# Patient Record
Sex: Female | Born: 1955 | Race: White | Hispanic: No | Marital: Married | Smoking: Never smoker
Health system: Southern US, Community
[De-identification: ages and names within clinical notes are randomized; demographics above are authoritative.]

## PROBLEM LIST (undated history)

## (undated) DIAGNOSIS — R0989 Other specified symptoms and signs involving the circulatory and respiratory systems: Secondary | ICD-10-CM

## (undated) DIAGNOSIS — R011 Cardiac murmur, unspecified: Secondary | ICD-10-CM

## (undated) DIAGNOSIS — M1990 Unspecified osteoarthritis, unspecified site: Secondary | ICD-10-CM

## (undated) DIAGNOSIS — Z8679 Personal history of other diseases of the circulatory system: Secondary | ICD-10-CM

## (undated) DIAGNOSIS — E039 Hypothyroidism, unspecified: Secondary | ICD-10-CM

## (undated) DIAGNOSIS — Z87448 Personal history of other diseases of urinary system: Secondary | ICD-10-CM

## (undated) DIAGNOSIS — J309 Allergic rhinitis, unspecified: Secondary | ICD-10-CM

## (undated) DIAGNOSIS — T7840XA Allergy, unspecified, initial encounter: Secondary | ICD-10-CM

## (undated) DIAGNOSIS — N323 Diverticulum of bladder: Secondary | ICD-10-CM

## (undated) DIAGNOSIS — D649 Anemia, unspecified: Secondary | ICD-10-CM

## (undated) HISTORY — DX: Anemia, unspecified: D64.9

## (undated) HISTORY — DX: Allergic rhinitis, unspecified: J30.9

## (undated) HISTORY — DX: Cardiac murmur, unspecified: R01.1

## (undated) HISTORY — PX: COLONOSCOPY: SHX174

## (undated) HISTORY — DX: Other specified symptoms and signs involving the circulatory and respiratory systems: R09.89

## (undated) HISTORY — DX: Diverticulum of bladder: N32.3

## (undated) HISTORY — DX: Personal history of other diseases of the circulatory system: Z86.79

## (undated) HISTORY — DX: Unspecified osteoarthritis, unspecified site: M19.90

## (undated) HISTORY — DX: Personal history of other diseases of urinary system: Z87.448

## (undated) HISTORY — PX: OTHER SURGICAL HISTORY: SHX169

## (undated) HISTORY — DX: Allergy, unspecified, initial encounter: T78.40XA

## (undated) HISTORY — PX: NO PAST SURGERIES: SHX2092

## (undated) HISTORY — DX: Hypothyroidism, unspecified: E03.9

---

## 2004-12-23 ENCOUNTER — Ambulatory Visit: Payer: Self-pay | Admitting: Internal Medicine

## 2005-09-27 ENCOUNTER — Ambulatory Visit: Payer: Self-pay | Admitting: Internal Medicine

## 2005-10-11 ENCOUNTER — Ambulatory Visit: Payer: Self-pay | Admitting: Internal Medicine

## 2005-10-11 LAB — HM COLONOSCOPY

## 2006-09-16 ENCOUNTER — Ambulatory Visit: Payer: Self-pay | Admitting: Internal Medicine

## 2006-09-16 LAB — CONVERTED CEMR LAB: TSH: 4.58 microintl units/mL (ref 0.35–5.50)

## 2007-01-23 ENCOUNTER — Ambulatory Visit: Payer: Self-pay | Admitting: Internal Medicine

## 2007-01-23 LAB — CONVERTED CEMR LAB
ALT: 16 units/L (ref 0–35)
AST: 20 units/L (ref 0–37)
Alkaline Phosphatase: 46 units/L (ref 39–117)
BUN: 7 mg/dL (ref 6–23)
Basophils Relative: 0.7 % (ref 0.0–1.0)
Bilirubin Urine: NEGATIVE
CO2: 26 meq/L (ref 19–32)
Calcium: 9.1 mg/dL (ref 8.4–10.5)
Chloride: 105 meq/L (ref 96–112)
Eosinophils Relative: 2.1 % (ref 0.0–5.0)
GFR calc Af Amer: 97 mL/min
Ketones, ur: NEGATIVE mg/dL
LDL Cholesterol: 62 mg/dL (ref 0–99)
Lymphocytes Relative: 27.5 % (ref 12.0–46.0)
Monocytes Relative: 5.7 % (ref 3.0–11.0)
Neutro Abs: 4.5 10*3/uL (ref 1.4–7.7)
Nitrite: NEGATIVE
Platelets: 239 10*3/uL (ref 150–400)
RBC: 3.96 M/uL (ref 3.87–5.11)
RDW: 12.4 % (ref 11.5–14.6)
Specific Gravity, Urine: 1.01 (ref 1.000–1.03)
Total CHOL/HDL Ratio: 1.9
Total Protein, Urine: NEGATIVE mg/dL
Total Protein: 6.7 g/dL (ref 6.0–8.3)
Triglycerides: 107 mg/dL (ref 0–149)
Urine Glucose: NEGATIVE mg/dL
VLDL: 21 mg/dL (ref 0–40)
WBC: 7.2 10*3/uL (ref 4.5–10.5)
pH: 6.5 (ref 5.0–8.0)

## 2007-01-24 ENCOUNTER — Encounter: Payer: Self-pay | Admitting: Internal Medicine

## 2007-01-30 ENCOUNTER — Ambulatory Visit: Payer: Self-pay | Admitting: Internal Medicine

## 2007-01-30 DIAGNOSIS — Z87448 Personal history of other diseases of urinary system: Secondary | ICD-10-CM

## 2007-01-30 DIAGNOSIS — J309 Allergic rhinitis, unspecified: Secondary | ICD-10-CM | POA: Insufficient documentation

## 2007-01-30 DIAGNOSIS — R0989 Other specified symptoms and signs involving the circulatory and respiratory systems: Secondary | ICD-10-CM | POA: Insufficient documentation

## 2007-01-30 DIAGNOSIS — D649 Anemia, unspecified: Secondary | ICD-10-CM

## 2007-01-30 DIAGNOSIS — Z8679 Personal history of other diseases of the circulatory system: Secondary | ICD-10-CM

## 2007-01-30 DIAGNOSIS — N323 Diverticulum of bladder: Secondary | ICD-10-CM

## 2007-01-30 HISTORY — DX: Personal history of other diseases of the circulatory system: Z86.79

## 2007-01-30 HISTORY — DX: Allergic rhinitis, unspecified: J30.9

## 2007-01-30 HISTORY — DX: Other specified symptoms and signs involving the circulatory and respiratory systems: R09.89

## 2007-01-30 HISTORY — DX: Personal history of other diseases of urinary system: Z87.448

## 2007-01-30 HISTORY — DX: Anemia, unspecified: D64.9

## 2007-01-30 HISTORY — DX: Diverticulum of bladder: N32.3

## 2007-02-08 ENCOUNTER — Encounter: Payer: Self-pay | Admitting: Internal Medicine

## 2007-02-27 ENCOUNTER — Encounter: Payer: Self-pay | Admitting: Internal Medicine

## 2007-02-27 ENCOUNTER — Ambulatory Visit: Payer: Self-pay

## 2007-02-27 ENCOUNTER — Telehealth: Payer: Self-pay | Admitting: Internal Medicine

## 2007-03-16 ENCOUNTER — Telehealth: Payer: Self-pay | Admitting: Internal Medicine

## 2008-10-02 LAB — CONVERTED CEMR LAB: Pap Smear: NORMAL

## 2009-01-27 ENCOUNTER — Ambulatory Visit: Payer: Self-pay | Admitting: Internal Medicine

## 2009-01-27 LAB — CONVERTED CEMR LAB
ALT: 16 units/L (ref 0–35)
AST: 19 units/L (ref 0–37)
Alkaline Phosphatase: 49 units/L (ref 39–117)
BUN: 10 mg/dL (ref 6–23)
Bilirubin Urine: NEGATIVE
Bilirubin, Direct: 0.1 mg/dL (ref 0.0–0.3)
Cholesterol: 169 mg/dL (ref 0–200)
Creatinine, Ser: 0.8 mg/dL (ref 0.4–1.2)
Eosinophils Relative: 1.6 % (ref 0.0–5.0)
GFR calc non Af Amer: 79.23 mL/min (ref >60)
Ketones, ur: NEGATIVE mg/dL
LDL Cholesterol: 65 mg/dL (ref 0–99)
Monocytes Relative: 5 % (ref 3.0–12.0)
Neutrophils Relative %: 62.9 % (ref 43.0–77.0)
Nitrite: NEGATIVE
Platelets: 274 10*3/uL (ref 150.0–400.0)
Potassium: 5.1 meq/L (ref 3.5–5.1)
RBC: 3.92 M/uL (ref 3.87–5.11)
Total Bilirubin: 0.6 mg/dL (ref 0.3–1.2)
Total CHOL/HDL Ratio: 2
Total Protein, Urine: NEGATIVE mg/dL
Triglycerides: 110 mg/dL (ref 0.0–149.0)
Urine Glucose: NEGATIVE mg/dL
VLDL: 22 mg/dL (ref 0.0–40.0)
WBC: 7.1 10*3/uL (ref 4.5–10.5)
pH: 6.5 (ref 5.0–8.0)

## 2009-02-24 ENCOUNTER — Ambulatory Visit: Payer: Self-pay | Admitting: Internal Medicine

## 2009-02-25 DIAGNOSIS — E039 Hypothyroidism, unspecified: Secondary | ICD-10-CM | POA: Insufficient documentation

## 2009-02-25 HISTORY — DX: Hypothyroidism, unspecified: E03.9

## 2010-03-17 NOTE — Assessment & Plan Note (Signed)
Summary: CPX / # / CD   Vital Signs:  Patient profile:   55 year old female Height:      67 inches Weight:      141 pounds BMI:     22.16 O2 Sat:      99 % on Room air Temp:     98.4 degrees F oral Pulse rate:   83 / minute BP sitting:   138 / 70  (left arm) Cuff size:   regular  Vitals Entered By: Ami Bullins CMA (February 24, 2009 1:24 PM)  O2 Flow:  Room air  Primary Care Provider:  Chimaobi Casebolt   History of Present Illness: Patient presents for medical follow up. In the interval since her last visit she has been generally healthy, i.e. no surgery, no hospitalizations no major injuries.   She does have some right hip pain - worse with first getting up and about.  Pain in the calve of the left leg - sore by the end of the day. Minor discomfort Gnarly DIP  of the right index - not really painful   Current with Gyn - Pelvic/PAP, normal Mammogram and breast exam.   Current Medications (verified): 1)  Levothyroxine Sodium 75 Mcg  Tabs (Levothyroxine Sodium) .Marland Kitchen.. 1 Once Daily 2)  Advil 200 Mg  Tabs (Ibuprofen) .... As Needed 3)  Sudafed 30 Mg  Tabs (Pseudoephedrine Hcl) .... As Needed 4)  Claritin 10 Mg  Tabs (Loratadine) .... As Needed 5)  Apri 0.15-30 Mg-Mcg Tabs (Desogestrel-Ethinyl Estradiol) .Marland Kitchen.. 1 Daily  Allergies (verified): No Known Drug Allergies  Past History:  Past Surgical History: Last updated: 02/14/2007 G2PS  Family History: Last updated: 2007/02/14 father - died 85 MVA EtOH mother -died 30, sudden death (heart vs. stroke), EtOH, HTN, hypothyroid 2 brothers - youngest with joint problems Neg- colon, breast cancer;  Secondary kinship with DM, RA  Risk Factors: Alcohol Use: <1 (February 14, 2007) Exercise: yes (February 14, 2007)  Risk Factors: Smoking Status: never (02-14-2007)  Past Medical History: UNSPECIFIED HYPOTHYROIDISM (ICD-244.9) CAROTID BRUIT, RIGHT (ICD-785.9) HEART MURMUR, HX OF (ICD-V12.50) UTI'S, HX OF (ICD-V13.00) DIVERTICULUM, BLADDER  (ICD-596.3) ANEMIA-NOS (ICD-285.9) ALLERGIC RHINITIS (ICD-477.9)       Physician Roster:          Gyn - Marlena Clipper, NP at [facility]  Social History: [place]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 1979 1 daughter 65 RN; 1 son 1988 [place]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, 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    Physical Exam  General:  WNWD white female looking younger than her stated age in no distress Head:  Normocephalic and atraumatic without obvious abnormalities. No apparent alopecia or balding. Eyes:  vision grossly intact, pupils equal, pupils round, corneas and lenses clear, no injection, no optic disk abnormalities, and no retinal abnormalitiies.   Ears:  External ear exam shows no significant lesions or deformities.  Otoscopic examination reveals clear canals, tympanic membranes are intact bilaterally without bulging, retraction, inflammation or discharge. Hearing is grossly normal bilaterally. Nose:  External nasal examination shows no deformity or inflammation. Nasal mucosa are pink and moist without lesions  or exudates. Mouth:  Oral mucosa and oropharynx without lesions or exudates.  Teeth in good repair. Neck:  supple, full ROM, and no thyromegaly.  Feint carotid bruits, high pitched, right neck. Chest Wall:  no deformities and no mass.   Breasts:  deferred to Gyn Lungs:  Normal respiratory effort, chest expands  symmetrically. Lungs are clear to auscultation, no crackles or wheezes. Heart:  Normal rate and regular rhythm. S1 and S2 normal without gallop, murmur, click, rub or other extra sounds. Abdomen:  soft, non-tender, normal bowel sounds, no guarding, and no hepatomegaly.   Genitalia:  deferred to gyn Msk:  normal ROM, no joint tenderness, no joint warmth, no redness over joints, and no joint instability.  DIP right index finger enlarged without synovial thickening, not tender. Pulses:  2+ radial and DP pulses Extremities:  No clubbing, cyanosis, edema, or deformity noted with normal full range of motion of all joints.   Neurologic:  alert & oriented X3, cranial nerves II-XII intact, strength normal in all extremities, gait normal, and DTRs symmetrical and normal.   Skin:  turgor normal, color normal, no suspicious lesions, no ulcerations, and no edema.   Cervical Nodes:  no anterior cervical adenopathy and no posterior cervical adenopathy.   Psych:  Oriented X3, memory intact for recent and remote, normally interactive, and good eye contact.     Impression & Recommendations:  Problem # 1:  CAROTID BRUIT, RIGHT (ICD-785.9) Reviewed Carotid Doppler from Dec '09 - normal with no evidence of Internal carotid occlusion. Bruits still auscultable - suspect external carotid origin. No further evaluation is indicated.   Problem # 2:  ANEMIA-NOS (ICD-285.9) Stable with recent lab revealing normal hemoglobin  Problem # 3:  UNSPECIFIED HYPOTHYROIDISM (ICD-244.9) Patient is on thyroid supplementation and her TSH is normal. she is asymptomatic.  Plan - continue present dose of medication.  Her updated medication list for this problem includes:    Levothyroxine Sodium 75 Mcg Tabs (Levothyroxine sodium) .Marland Kitchen... 1 once daily  Problem # 4:  Preventive Health Care (ICD-V70.0) Unremarkable history except for minor hip discomfort, early minor degenerative changes hands and minor leg discomfort left lateral  calve with a normal exam. She is current with her gyncecologist. She is up to date for colorectal cancer screening. Immunization record is incomplete and we will need to document tetnus status. She has had flu vaccine this year..  In summary - a very nice woman who is in good health. She is advised to return for general medical exam in 2-3 years. Thyroid function monitoring should be repeated in 1 year. She will need routine repeat lab, e.g. lipid panel, general chemistries in 3-5 years. Of course she may return as needed.  Complete Medication List: 1)  Levothyroxine Sodium 75 Mcg Tabs (Levothyroxine sodium) .Marland Kitchen.. 1 once daily 2)  Advil 200 Mg Tabs (Ibuprofen) .... As needed 3)  Sudafed 30 Mg Tabs (Pseudoephedrine hcl) .... As needed 4)  Claritin 10 Mg Tabs (Loratadine) .... As needed 5)  Apri 0.15-30 Mg-mcg Tabs (Desogestrel-ethinyl estradiol) .Marland Kitchen.. 1 daily   Patient: Amber Soto Note: All result statuses are Final unless otherwise noted.  Tests: (1) Lipid Panel (LIPID)   Cholesterol               169 mg/dL                   1-610     ATP III Classification            Desirable:  <  200 mg/dL                    Borderline High:  200 - 239 mg/dL               High:  > = 240 mg/dL   Triglycerides             110.0 mg/dL                 1.6-109.6     Normal:  <150 mg/dL     Borderline High:  045 - 199 mg/dL   HDL                       40.98 mg/dL                 >11.91   VLDL Cholesterol          22.0 mg/dL                  4.7-82.9   LDL Cholesterol           65 mg/dL                    5-62  CHO/HDL Ratio:  CHD Risk                             2                    Men          Women     1/2 Average Risk     3.4          3.3     Average Risk          5.0          4.4     2X Average Risk          9.6          7.1     3X Average Risk          15.0          11.0                           Tests: (2) BMP (METABOL)   Sodium                    141 mEq/L                   135-145    Potassium                 5.1 mEq/L                   3.5-5.1   Chloride                  107 mEq/L                   96-112   Carbon Dioxide            27 mEq/L                    19-32   Glucose              [H]  100 mg/dL  70-99   BUN                       10 mg/dL                    2-13   Creatinine                0.8 mg/dL                   0.8-6.5   Calcium                   9.2 mg/dL                   7.8-46.9   GFR                       79.23 mL/min                >60  Tests: (3) CBC Platelet w/Diff (CBCD)   White Cell Count          7.1 K/uL                    4.5-10.5   Red Cell Count            3.92 Mil/uL                 3.87-5.11   Hemoglobin                13.6 g/dL                   62.9-52.8   Hematocrit                39.3 %                      36.0-46.0   MCV                  [H]  100.3 fl                    78.0-100.0   MCHC                      34.6 g/dL                   41.3-24.4   RDW                       12.1 %                      11.5-14.6   Platelet Count            274.0 K/uL                  150.0-400.0   Neutrophil %              62.9 %                      43.0-77.0   Lymphocyte %              29.6 %                      12.0-46.0   Monocyte %  5.0 %                       3.0-12.0   Eosinophils%              1.6 %                       0.0-5.0   Basophils %               0.9 %                       0.0-3.0   Neutrophill Absolute      4.4 K/uL                    1.4-7.7   Lymphocyte Absolute       2.1 K/uL                    0.7-4.0   Monocyte Absolute         0.4 K/uL                    0.1-1.0  Eosinophils, Absolute                             0.1 K/uL                    0.0-0.7   Basophils Absolute        0.1 K/uL                    0.0-0.1  Tests: (4) Hepatic/Liver Function Panel (HEPATIC)   Total Bilirubin           0.6 mg/dL                   6.2-9.5   Direct Bilirubin          0.1 mg/dL                   2.8-4.1    Alkaline Phosphatase      49 U/L                      39-117   AST                       19 U/L                      0-37   ALT                       16 U/L                      0-35   Total Protein             7.2 g/dL                    3.2-4.4   Albumin                   3.7 g/dL                    0.1-0.2  Tests: (5) TSH (TSH)   FastTSH  4.66 uIU/mL                 0.35-5.50  Tests: (6) UDip w/Micro (URINE)   Color                     LT. YELLOW       RANGE:  Yellow;Lt. Yellow   Clarity                   CLEAR                       Clear   Specific Gravity          <=1.005                     1.000 - 1.030   Urine Ph                  6.5                         5.0-8.0   Protein                   NEGATIVE                    Negative   Urine Glucose             NEGATIVE                    Negative   Ketones                   NEGATIVE                    Negative   Urine Bilirubin           NEGATIVE                    Negative   Blood                     NEGATIVE                    Negative   Urobilinogen              0.2                         0.0 - 1.0   Leukocyte Esterace        TRACE                       Negative   Nitrite                   NEGATIVE                    Negative   Urine WBC                 3-6/hpf                     0-2/hpf   Urine Epith               Few(5-10/hpf)               Rare(0-4/hpf)   Urine Bacteria  Few(10-50/hpf)              NonePrescriptions: LEVOTHYROXINE SODIUM 75 MCG  TABS (LEVOTHYROXINE SODIUM) 1 once daily  #90 x 3   Entered and Authorized by:   Jacques Navy MD   Signed by:   Jacques Navy MD on 02/24/2009   Method used:   Electronically to        [facility] [address]       [address], [place]  [address]       Ph: [phone]       Fax: [phone]   RxID:   [id number]    Preventive Care Screening  Mammogram:    Date:  10/02/2008    Results:  normal   Pap Smear:    Date:   10/02/2008    Results:  normal

## 2010-10-05 ENCOUNTER — Ambulatory Visit: Payer: Self-pay

## 2010-10-05 DIAGNOSIS — Z0000 Encounter for general adult medical examination without abnormal findings: Secondary | ICD-10-CM

## 2010-10-05 LAB — LIPID PANEL
HDL: 92.9 mg/dL (ref >39.00)
LDL Cholesterol: 69 mg/dL (ref 0–99)
Total CHOL/HDL Ratio: 2
VLDL: 14.6 mg/dL (ref 0.0–40.0)

## 2010-10-05 LAB — CBC WITH DIFFERENTIAL/PLATELET
Basophils Absolute: 0 10*3/uL (ref 0.0–0.1)
Hemoglobin: 13.9 g/dL (ref 12.0–15.0)
Lymphocytes Relative: 43.6 % (ref 12.0–46.0)
Monocytes Relative: 7.6 % (ref 3.0–12.0)
Platelets: 187 10*3/uL (ref 150.0–400.0)
RDW: 13.6 % (ref 11.5–14.6)

## 2010-10-05 LAB — BASIC METABOLIC PANEL
CO2: 27 mEq/L (ref 19–32)
Chloride: 105 mEq/L (ref 96–112)
GFR: 79.9 mL/min (ref >60.00)
Glucose, Bld: 80 mg/dL (ref 70–99)
Potassium: 5.3 mEq/L — ABNORMAL HIGH (ref 3.5–5.1)
Sodium: 141 mEq/L (ref 135–145)

## 2010-10-05 LAB — HEPATIC FUNCTION PANEL
AST: 22 U/L (ref 0–37)
Albumin: 4.2 g/dL (ref 3.5–5.2)

## 2010-10-05 LAB — URINALYSIS
Bilirubin Urine: NEGATIVE
Hgb urine dipstick: NEGATIVE
Nitrite: NEGATIVE
Total Protein, Urine: NEGATIVE
Urobilinogen, UA: 0.2 (ref 0.0–1.0)

## 2010-10-05 LAB — TSH: TSH: 3.6 u[IU]/mL (ref 0.35–5.50)

## 2010-10-08 ENCOUNTER — Encounter: Payer: Self-pay | Admitting: Internal Medicine

## 2010-10-08 ENCOUNTER — Ambulatory Visit (INDEPENDENT_AMBULATORY_CARE_PROVIDER_SITE_OTHER): Payer: 59 | Admitting: Internal Medicine

## 2010-10-08 VITALS — BP 120/68 | HR 80 | Temp 97.9°F | Wt 140.0 lb

## 2010-10-08 DIAGNOSIS — E039 Hypothyroidism, unspecified: Secondary | ICD-10-CM

## 2010-10-08 DIAGNOSIS — Z0000 Encounter for general adult medical examination without abnormal findings: Secondary | ICD-10-CM

## 2010-10-08 DIAGNOSIS — D649 Anemia, unspecified: Secondary | ICD-10-CM

## 2010-10-08 DIAGNOSIS — Z136 Encounter for screening for cardiovascular disorders: Secondary | ICD-10-CM

## 2010-10-08 DIAGNOSIS — M1990 Unspecified osteoarthritis, unspecified site: Secondary | ICD-10-CM

## 2010-10-08 NOTE — Progress Notes (Signed)
Subjective:    Patient ID: Amber Soto, female    DOB: Sep 08, 1955, 55 y.o.   MRN: [id number]  HPI Amber Soto presents for a routine medical exam. She has been doing well. She has had pain in the lateral left foot especially with prolonged standing. Good shoes with orthotics helps. She is also having discomfort at the MCP joints bilaterally. No real joint deformity. Current with gynecologist with up-coming appointment. She has had a mammogram. Current with colonoscopy. During the year she did have a UTI with cipro resistant e.coli.  Past Medical History  Diagnosis Date  . Unspecified hypothyroidism 02/25/2009  . ANEMIA-NOS 01/30/2007  . ALLERGIC RHINITIS 01/30/2007  . DIVERTICULUM, BLADDER 01/30/2007  . HEART MURMUR, HX OF 01/30/2007  . CAROTID BRUIT, RIGHT 01/30/2007  . UTI'S, HX OF 01/30/2007   Past Surgical History  Procedure Date  . None    Family History  Problem Relation Age of Onset  . Hypertension Mother   . Thyroid disease Mother     hypothyroid  . Alcohol abuse Mother   . Stroke Mother   . Alcohol abuse Father   . Diabetes Other   . Cancer Maternal Aunt     bone cancer  . Arthritis Maternal Aunt     RA  . Arthritis Brother   . Depression Brother    History   Social History  . Marital Status: Married    Spouse Name: N/A    Number of Children: 2  . Years of Education: 16   Occupational History  . Retail Cosmetic sales    Social History Main Topics  . Smoking status: Never Smoker   . Smokeless tobacco: Never Used  . Alcohol Use: 3.0 oz/week    6 drink(s) per week  . Drug Use: No  . Sexually Active: Yes -- Female partner(s)   Other Topics Concern  . 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 Married 1979. 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 SO in good health       Review of Systems Review of Systems  Constitutional:   Negative for fever, chills, activity change and unexpected weight change.  HEENT:  Negative for hearing loss, ear pain, congestion, neck stiffness and postnasal drip. Negative for sore throat or swallowing problems. Negative for dental complaints.   Eyes: Negative for vision loss or change in visual acuity.  Respiratory: Negative for chest tightness and wheezing.   Cardiovascular: Negative for chest pain and palpitation. No decreased exercise tolerance Gastrointestinal: No change in bowel habit. No bloating or gas. No reflux or indigestion Genitourinary: Negative for urgency, frequency, flank pain and difficulty urinating.  Musculoskeletal: Negative for myalgias, back pain, arthralgias and gait problem.  Neurological: Negative for dizziness, tremors, weakness and headaches.  Hematological: Negative for adenopathy.  Psychiatric/Behavioral: Negative for behavioral problems and dysphoric mood.  Derm - clear . Sees dermatologist     Objective:   Physical Exam Vitals reviewed. Gen'l: well nourished, well developed white woman in no distress HEENT - /AT, EACs/TMs normal, oropharynx with native dentition in good condition, no buccal or palatal lesions, posterior pharynx clear, mucous membranes moist. C&S clear, PERRLA, fundi - normal Neck - supple, no thyromegaly Nodes- negative submental, cervical, supraclavicular regions Chest - no deformity, no CVAT Lungs - cleat without rales, wheezes. No increased work of breathing Breast - deferred to gyn Cardiovascular - regular rate and rhythm,  quiet precordium, no murmurs, rubs or gallops, 2+ radial, DP and PT pulses Abdomen - BS+ x 4, no HSM, no guarding or rebound or tenderness Pelvic - deferred to gyn Rectal - deferred to gyn Extremities - no clubbing, cyanosis, edema or deformity.  Neuro - A&O x 3, CN II-XII normal, motor strength normal and equal, DTRs 2+ and symmetrical biceps, radial, and patellar tendons. Cerebellar - no tremor, no rigidity,  fluid movement and normal gait. Derm - Head, neck, back, abdomen and extremities without suspicious lesions   Lab Results  Component Value Date   WBC 5.2 10/05/2010   HGB 13.9 10/05/2010   HCT 42.5 10/05/2010   PLT 187.0 10/05/2010   CHOL 176 10/05/2010   TRIG 73.0 10/05/2010   HDL 92.90 10/05/2010   ALT 23 10/05/2010   AST 22 10/05/2010   NA 141 10/05/2010   K 5.3* 10/05/2010   CL 105 10/05/2010   CREATININE 0.8 10/05/2010   BUN 14 10/05/2010   CO2 27 10/05/2010   TSH 3.60 10/05/2010        Glucose                 80  12 lead EKG with PAC, low voltage, right axis deviation.       Assessment & Plan:

## 2010-10-11 DIAGNOSIS — M1990 Unspecified osteoarthritis, unspecified site: Secondary | ICD-10-CM | POA: Insufficient documentation

## 2010-10-11 DIAGNOSIS — Z0000 Encounter for general adult medical examination without abnormal findings: Secondary | ICD-10-CM | POA: Insufficient documentation

## 2010-10-11 DIAGNOSIS — Z0001 Encounter for general adult medical examination with abnormal findings: Secondary | ICD-10-CM | POA: Insufficient documentation

## 2010-10-11 NOTE — Assessment & Plan Note (Signed)
Interval medical history is unremarkable except for minor arthritic discomfort. Physical exam is unremarkable. Lab results are within normal limits. She is current with colorectal cancer screening with last study August '07. Current with breast health with last mammogram in EMR of July '11 and breast exam by gyn. Immunizations: Tetanus March '12. 12 lead EKG w/o evidence of ischemia or injury.  In summary- a very pleasant woman who seems to be medically stable and in good health. She is encouraged to adopt ROM flex/stretch exercise; conservative management of mild osteoarthritis. She will reutrn as needed or in 2 years for a general medical exam. Any prescriptions may be filled in the interval. She should have thyroid labs in one year, office visit not required for this.

## 2010-10-11 NOTE — Assessment & Plan Note (Signed)
Lab Results  Component Value Date   WBC 5.2 10/05/2010   HGB 13.9 10/05/2010   HCT 42.5 10/05/2010   MCV 100.3* 10/05/2010   PLT 187.0 10/05/2010   Stable hemoglobin. With mildly elevated MCV would recommend B12 containing supplement or vitamin.

## 2010-10-11 NOTE — Assessment & Plan Note (Signed)
Mild symptoms with joint deformity or limitation in activity.  Plan - ROM exercises for hands: soft ball squeeze, continued use.            Heat - hand soaks, foot soaks           APAP or otc NSAIDs as needed for discomfort.

## 2010-10-11 NOTE — Assessment & Plan Note (Signed)
Lab Results  Component Value Date   TSH 3.60 10/05/2010   Good control on present medication. NO change in regimen

## 2011-02-23 ENCOUNTER — Telehealth: Payer: Self-pay | Admitting: Internal Medicine

## 2011-02-23 NOTE — Telephone Encounter (Signed)
Per pt change levothyroxine gram to 3 mth instead of 1 mth per pt---[facility] [address] [place]--[phone]

## 2011-03-01 ENCOUNTER — Other Ambulatory Visit: Payer: Self-pay

## 2011-03-01 MED ORDER — LEVOTHYROXINE SODIUM 75 MCG PO TABS: ORAL_TABLET | ORAL | Status: DC

## 2011-06-03 NOTE — Telephone Encounter (Signed)
[facility] as requested.

## 2011-08-22 ENCOUNTER — Other Ambulatory Visit: Payer: Self-pay | Admitting: Internal Medicine

## 2011-08-23 ENCOUNTER — Other Ambulatory Visit: Payer: Self-pay | Admitting: General Practice

## 2011-08-23 MED ORDER — LEVOTHYROXINE SODIUM 75 MCG PO TABS: ORAL_TABLET | ORAL | Status: DC

## 2012-03-03 ENCOUNTER — Other Ambulatory Visit: Payer: Self-pay | Admitting: Internal Medicine

## 2012-06-20 ENCOUNTER — Other Ambulatory Visit: Payer: Self-pay | Admitting: Internal Medicine

## 2012-06-25 ENCOUNTER — Other Ambulatory Visit: Payer: Self-pay | Admitting: Internal Medicine

## 2012-09-11 ENCOUNTER — Other Ambulatory Visit (INDEPENDENT_AMBULATORY_CARE_PROVIDER_SITE_OTHER): Payer: 59

## 2012-09-11 ENCOUNTER — Ambulatory Visit (INDEPENDENT_AMBULATORY_CARE_PROVIDER_SITE_OTHER): Payer: 59 | Admitting: Internal Medicine

## 2012-09-11 ENCOUNTER — Encounter: Payer: Self-pay | Admitting: Internal Medicine

## 2012-09-11 ENCOUNTER — Telehealth: Payer: Self-pay | Admitting: Internal Medicine

## 2012-09-11 VITALS — BP 120/70 | HR 89 | Temp 98.1°F | Ht 67.0 in | Wt 132.0 lb

## 2012-09-11 DIAGNOSIS — D649 Anemia, unspecified: Secondary | ICD-10-CM

## 2012-09-11 DIAGNOSIS — E039 Hypothyroidism, unspecified: Secondary | ICD-10-CM

## 2012-09-11 DIAGNOSIS — M1990 Unspecified osteoarthritis, unspecified site: Secondary | ICD-10-CM

## 2012-09-11 DIAGNOSIS — Z0000 Encounter for general adult medical examination without abnormal findings: Secondary | ICD-10-CM

## 2012-09-11 DIAGNOSIS — R0989 Other specified symptoms and signs involving the circulatory and respiratory systems: Secondary | ICD-10-CM

## 2012-09-11 LAB — COMPREHENSIVE METABOLIC PANEL
Albumin: 4.1 g/dL (ref 3.5–5.2)
BUN: 13 mg/dL (ref 6–23)
CO2: 30 mEq/L (ref 19–32)
GFR: 91.24 mL/min (ref >60.00)
Glucose, Bld: 98 mg/dL (ref 70–99)
Sodium: 141 mEq/L (ref 135–145)
Total Bilirubin: 0.5 mg/dL (ref 0.3–1.2)
Total Protein: 7 g/dL (ref 6.0–8.3)

## 2012-09-11 MED ORDER — LEVOTHYROXINE SODIUM 75 MCG PO TABS: 75.0000 ug | ORAL_TABLET | Freq: Every day | ORAL | Status: DC

## 2012-09-11 NOTE — Patient Instructions (Addendum)
Thanks for coming in to see me.  Your exam is normal.   Labs are ordered and will be available on MyChart - please sign up.  All health maintenance is current and up to date. You may have the shingles shot prior to age 57 but please check on coverage.

## 2012-09-11 NOTE — Telephone Encounter (Signed)
rec'd records from [address] of [place]. Forward 25 pages to Dr. Debby Bud

## 2012-09-11 NOTE — Progress Notes (Signed)
Subjective:    Patient ID: Amber Soto, female    DOB: 1955/11/14, 57 y.o.   MRN: [id number]  HPI [name] presents for a general physical exam. Over the interval since her last visit Aug '12 - she has been well. She recently had gyn exam - normal. She had DEXA  Revealing osteoporosis and she tried and failed Bisphosphonate and has started q 6 month Prolia injections. She is taking calcium, vitamin D and lite weight bearing exercise. PAP normal, breast exam negative, mammogram normal.    Review of Systems Constitutional:  Negative for fever, chills, activity change and unexpected weight change.  HEENT:  Negative for hearing loss, ear pain, congestion, neck stiffness and postnasal drip. Negative for sore throat or swallowing problems. Negative for dental complaints.   Eyes: Negative for vision loss or change in visual acuity.  Respiratory: Negative for chest tightness and wheezing. Negative for DOE.   Cardiovascular: Negative for chest pain or palpitations. No decreased exercise tolerance Gastrointestinal: No change in bowel habit. No bloating or gas. No reflux or indigestion Genitourinary: Negative for urgency, frequency, flank pain and difficulty urinating.  Musculoskeletal: Negative for myalgias, back pain, arthralgias and gait problem.  Neurological: Negative for dizziness, tremors, weakness and headaches.  Hematological: Negative for adenopathy.  Psychiatric/Behavioral: Negative for behavioral problems and dysphoric mood.       Objective:   Physical Exam Filed Vitals:   09/11/12 1014  BP: 120/70  Pulse: 89  Temp: 98.1 F (36.7 C)   Wt Readings from Last 3 Encounters:  09/11/12 132 lb (59.875 kg)  10/08/10 140 lb (63.504 kg)  02/24/09 141 lb (63.957 kg)   Gen'l: well nourished, well developed white Woman in no distress HEENT - [place]/AT, EACs/TMs normal, oropharynx with native dentition in good condition, no buccal or palatal lesions, posterior pharynx clear, mucous  membranes moist. C&S clear, PERRLA, fundi - normal Neck - supple, no thyromegaly Nodes- negative submental, cervical, supraclavicular regions Chest - no deformity, no CVAT Lungs - clear without rales, wheezes. No increased work of breathing Breast - deferred to gyn Cardiovascular - regular rate and rhythm, quiet precordium, no murmurs, rubs or gallops, 2+ radial, DP and PT pulses Abdomen - BS+ x 4, no HSM, no guarding or rebound or tenderness Pelvic - deferred to gyn Rectal - deferred to gyn Extremities - no clubbing, cyanosis, edema. Mild enlargement DIP joints especially right index finger. Normal grip and range of motion Neuro - A&O x 3, CN II-XII normal, motor strength normal and equal, DTRs 2+ and symmetrical biceps, radial, and patellar tendons. Cerebellar - no tremor, no rigidity, fluid movement and normal gait. Derm - Head, neck, back, abdomen and extremities without suspicious lesions  Recent Results (from the past 2160 hour(s))  TSH     Status: None   Collection Time    09/11/12 11:13 AM      Result Value Range   TSH 2.02  0.35 - 5.50 uIU/mL  T4, FREE     Status: None   Collection Time    09/11/12 11:13 AM      Result Value Range   Free T4 0.93  0.60 - 1.60 ng/dL  COMPREHENSIVE METABOLIC PANEL     Status: None   Collection Time    09/11/12 11:13 AM      Result Value Range   Sodium 141  135 - 145 mEq/L   Potassium 5.0  3.5 - 5.1 mEq/L   Chloride 106  96 - 112 mEq/L  CO2 30  19 - 32 mEq/L   Glucose, Bld 98  70 - 99 mg/dL   BUN 13  6 - 23 mg/dL   Creatinine, Ser 0.7  0.4 - 1.2 mg/dL   Total Bilirubin 0.5  0.3 - 1.2 mg/dL   Alkaline Phosphatase 49  39 - 117 U/L   AST 18  0 - 37 U/L   ALT 17  0 - 35 U/L   Total Protein 7.0  6.0 - 8.3 g/dL   Albumin 4.1  3.5 - 5.2 g/dL   Calcium 9.5  8.4 - 16.1 mg/dL   GFR 09.60  >45.40 mL/min         Assessment & Plan:

## 2012-09-12 NOTE — Assessment & Plan Note (Signed)
Progressive symptoms, especially hands. Mild DIP changes, especially right index.  Plan Heat immersion to reduce discomfort  Daily use - soft rubber ball or other exercise for mobility  APAP as first line drug for pain, followed by NSAID of choice.

## 2012-09-12 NOTE — Assessment & Plan Note (Signed)
No bruits on exam today. Carotid doppler 2009 - read out as normal w/o obstructive findings.  Plan  routine risk modification: health diet, regular exercise

## 2012-09-12 NOTE — Assessment & Plan Note (Signed)
Old problem currently w/o symptoms. Last hemoglobin 2012 was 13.9  Plan No testing or treatment indicated

## 2012-09-12 NOTE — Assessment & Plan Note (Signed)
Interval medical history benign. Physical exam normal with mild arthritic change hands. She is current with Gyn exam, colorectal and breast cancer screening. Immunization is brought up to date with Tdap.  In summary - a very pleasant woman who appears medically stable. She will return in 2 years or sooner as needed.

## 2012-09-12 NOTE — Assessment & Plan Note (Signed)
Lab today with normal TSH and FT4  Plan Continue present medication and dose.

## 2012-12-21 ENCOUNTER — Other Ambulatory Visit: Payer: Self-pay

## 2013-09-21 ENCOUNTER — Other Ambulatory Visit: Payer: Self-pay | Admitting: *Deleted

## 2013-09-21 MED ORDER — LEVOTHYROXINE SODIUM 75 MCG PO TABS: 75.0000 ug | ORAL_TABLET | Freq: Every day | ORAL | Status: DC

## 2013-10-23 ENCOUNTER — Encounter: Payer: Self-pay | Admitting: Internal Medicine

## 2013-10-23 ENCOUNTER — Ambulatory Visit (INDEPENDENT_AMBULATORY_CARE_PROVIDER_SITE_OTHER): Payer: 59 | Admitting: Internal Medicine

## 2013-10-23 VITALS — BP 110/58 | HR 66 | Temp 98.1°F | Ht 67.0 in | Wt 134.0 lb

## 2013-10-23 DIAGNOSIS — M19039 Primary osteoarthritis, unspecified wrist: Secondary | ICD-10-CM | POA: Insufficient documentation

## 2013-10-23 DIAGNOSIS — Z13220 Encounter for screening for lipoid disorders: Secondary | ICD-10-CM

## 2013-10-23 DIAGNOSIS — Z0000 Encounter for general adult medical examination without abnormal findings: Secondary | ICD-10-CM

## 2013-10-23 DIAGNOSIS — M19049 Primary osteoarthritis, unspecified hand: Secondary | ICD-10-CM

## 2013-10-23 DIAGNOSIS — E039 Hypothyroidism, unspecified: Secondary | ICD-10-CM

## 2013-10-23 LAB — LIPID PANEL
CHOL/HDL RATIO: 2
Cholesterol: 180 mg/dL (ref 0–200)
HDL: 91.8 mg/dL (ref >39.00)
LDL Cholesterol: 70 mg/dL (ref 0–99)
NonHDL: 88.2
TRIGLYCERIDES: 92 mg/dL (ref 0.0–149.0)
VLDL: 18.4 mg/dL (ref 0.0–40.0)

## 2013-10-23 LAB — COMPREHENSIVE METABOLIC PANEL
ALBUMIN: 4.1 g/dL (ref 3.5–5.2)
ALT: 14 U/L (ref 0–35)
AST: 21 U/L (ref 0–37)
Alkaline Phosphatase: 46 U/L (ref 39–117)
BUN: 13 mg/dL (ref 6–23)
CALCIUM: 9.2 mg/dL (ref 8.4–10.5)
CHLORIDE: 108 meq/L (ref 96–112)
CO2: 29 meq/L (ref 19–32)
Creatinine, Ser: 0.6 mg/dL (ref 0.4–1.2)
GFR: 104.55 mL/min (ref >60.00)
Glucose, Bld: 93 mg/dL (ref 70–99)
POTASSIUM: 4.6 meq/L (ref 3.5–5.1)
Sodium: 142 mEq/L (ref 135–145)
Total Bilirubin: 0.4 mg/dL (ref 0.2–1.2)
Total Protein: 7.2 g/dL (ref 6.0–8.3)

## 2013-10-23 LAB — CBC
HEMATOCRIT: 41.1 % (ref 36.0–46.0)
Hemoglobin: 13.7 g/dL (ref 12.0–15.0)
MCHC: 33.3 g/dL (ref 30.0–36.0)
MCV: 98.4 fl (ref 78.0–100.0)
PLATELETS: 237 10*3/uL (ref 150.0–400.0)
RBC: 4.18 Mil/uL (ref 3.87–5.11)
RDW: 13.9 % (ref 11.5–15.5)
WBC: 10.4 10*3/uL (ref 4.0–10.5)

## 2013-10-23 LAB — SEDIMENTATION RATE: SED RATE: 18 mm/h (ref 0–22)

## 2013-10-23 LAB — TSH: TSH: 2.03 u[IU]/mL (ref 0.35–4.50)

## 2013-10-23 NOTE — Progress Notes (Signed)
Pre visit review using our clinic review tool, if applicable. No additional management support is needed unless otherwise documented below in the visit note. 

## 2013-10-23 NOTE — Progress Notes (Signed)
HPI  Pt presents to the clinic today to establish care. She is transferring care from Dr. Debby Bud. She does have some concerns today about a mole on her chest. She noticed this about 4 months ago. It does not itch, but she wants to have it checked out. She is also concerned about arthritis in her hands. She does have achy pain on a daily basis. She will occasionally take Ibuprofen if the pain is very bad. She does have a family history of RA but has never been told that she has RA.  Flu: yearly Tetanus: < 10 years LMP: post menopausal Pap Smear: 05/2013 Mammogram: 2015 Bone Density: 2013 Colonoscopy: 2010- normal Eye Doctor: yearly Dentist: Biannually  Past Medical History  Diagnosis Date  . Unspecified hypothyroidism 02/25/2009  . ANEMIA-NOS 01/30/2007  . ALLERGIC RHINITIS 01/30/2007  . DIVERTICULUM, BLADDER 01/30/2007  . HEART MURMUR, HX OF 01/30/2007  . CAROTID BRUIT, RIGHT 01/30/2007  . UTI'S, HX OF 01/30/2007    Current Outpatient Prescriptions  Medication Sig Dispense Refill  . Cholecalciferol (VITAMIN D3) 2000 UNITS TABS Take 1 tablet by mouth daily.      Marland Kitchen denosumab (PROLIA) 60 MG/ML SOLN injection Inject 60 mg into the skin every 6 (six) months. Administer in upper arm, thigh, or abdomen      . estradiol (VAGIFEM) 25 MCG vaginal tablet Place 25 mcg vaginally 2 (two) times a week.      Marland Kitchen ibuprofen (ADVIL,MOTRIN) 200 MG tablet Take 200 mg by mouth as needed.        Marland Kitchen levothyroxine (SYNTHROID, LEVOTHROID) 75 MCG tablet Take 1 tablet (75 mcg total) by mouth daily.  90 tablet  0  . loratadine (CLARITIN) 10 MG tablet Take 10 mg by mouth as needed.        . pseudoephedrine (SUDAFED) 30 MG tablet Take 30 mg by mouth as needed.         No current facility-administered medications for this visit.    No Known Allergies  Family History  Problem Relation Age of Onset  . Hypertension Mother   . Thyroid disease Mother     hypothyroid  . Alcohol abuse Mother   . Stroke Mother   .  Alcohol abuse Father   . Diabetes Other   . Cancer Maternal Aunt     bone cancer  . Arthritis Maternal Aunt     RA  . Arthritis Brother   . Depression Brother     History   Social History  . Marital Status: Married    Spouse Name: N/A    Number of Children: 2  . Years of Education: 16   Occupational History  . Retail Cosmetic sales    Social History Main Topics  . Smoking status: Never Smoker   . Smokeless tobacco: Never Used  . Alcohol Use: 3.0 oz/week    6 drink(s) per week     Comment: occasional  . Drug Use: No  . Sexual Activity: Yes    Partners: Male   Other Topics Concern  . 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 1 daughter 1985-married RN; 1 son 85 [place]-graduated, works [employer]-married '14. 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 SO in good health    ROS:  Constitutional: Denies fever, malaise, fatigue, headache or abrupt weight changes.  HEENT: Denies eye pain, eye redness, ear pain, ringing in the ears, wax buildup, runny nose, nasal congestion, bloody nose, or  sore throat. Respiratory: Denies difficulty breathing, shortness of breath, cough or sputum production.   Cardiovascular: Denies chest pain, chest tightness, palpitations or swelling in the hands or feet.  Gastrointestinal: Denies abdominal pain, bloating, constipation, diarrhea or blood in the stool.  GU: Denies frequency, urgency, pain with urination, blood in urine, odor or discharge. Musculoskeletal: Pt reports arthritis in hands. Denies decrease in range of motion, difficulty with gait, muscle pain.  Skin: Denies redness, rashes, lesions or ulcercations.  Neurological: Denies dizziness, difficulty with memory, difficulty with speech or problems with balance and coordination.   No other specific complaints in a complete review of systems (except as listed in HPI above).  PE:  BP 110/58  Pulse 66  Temp(Src) 98.1 F  (36.7 C) (Oral)  Ht  (1.702 m)  Wt 134 lb (60.782 kg)  BMI 20.98 kg/m2  SpO2 98% Wt Readings from Last 3 Encounters:  10/23/13 134 lb (60.782 kg)  09/11/12 132 lb (59.875 kg)  10/08/10 140 lb (63.504 kg)    General: Appears her stated age, well developed, well nourished in NAD. HEENT: Head: normal shape and size; Eyes: sclera white, no icterus, conjunctiva pink, PERRLA and EOMs intact; Ears: Tm's gray and intact, normal light reflex; Nose: mucosa pink and moist, septum midline; Throat/Mouth: Teeth present, mucosa pink and moist, no lesions or ulcerations noted.  Cardiovascular: Normal rate and rhythm. S1,S2 noted.  No murmur, rubs or gallops noted.  Pulmonary/Chest: Normal effort and positive vesicular breath sounds. No respiratory distress. No wheezes, rales or ronchi noted.  Abdomen: Soft and nontender. Normal bowel sounds, no bruits noted. No distention or masses noted. Liver, spleen and kidneys non palpable. Musculoskeletal: Normal flexion and extension of the fingers and wrist. Herbden's nodes noted of bilateral 2nd fingers. Neurological: Alert and oriented. Cranial nerves II-XII grossly intact.  Psychiatric: Mood and affect normal. Behavior is normal. Judgment and thought content normal.     BMET    Component Value Date/Time   NA 141 09/11/2012 1113   K 5.0 09/11/2012 1113   CL 106 09/11/2012 1113   CO2 30 09/11/2012 1113   GLUCOSE 98 09/11/2012 1113   BUN 13 09/11/2012 1113   CREATININE 0.7 09/11/2012 1113   CALCIUM 9.5 09/11/2012 1113   GFRNONAA 79.23 01/27/2009 0916   GFRAA 97 01/23/2007 0959    Lipid Panel     Component Value Date/Time   CHOL 176 10/05/2010 1154   TRIG 73.0 10/05/2010 1154   HDL 92.90 10/05/2010 1154   CHOLHDL 2 10/05/2010 1154   VLDL 14.6 10/05/2010 1154   LDLCALC 69 10/05/2010 1154    CBC    Component Value Date/Time   WBC 5.2 10/05/2010 1154   RBC 4.24 10/05/2010 1154   HGB 13.9 10/05/2010 1154   HCT 42.5 10/05/2010 1154   PLT 187.0 10/05/2010  1154   MCV 100.3* 10/05/2010 1154   MCHC 32.6 10/05/2010 1154   RDW 13.6 10/05/2010 1154   LYMPHSABS 2.3 10/05/2010 1154   MONOABS 0.4 10/05/2010 1154   EOSABS 0.2 10/05/2010 1154   BASOSABS 0.0 10/05/2010 1154    Hgb A1C No results found for this basename: HGBA1C     Assessment and Plan:  Preventative Health Maintenance:  She declines flu shot today- will get it later in the season Screening labs today CBC, CMET, Lipid  RTC in 6 months for followup thyroid

## 2013-10-23 NOTE — Patient Instructions (Addendum)

## 2013-10-23 NOTE — Assessment & Plan Note (Signed)
Will check TSH today Will adjust/refill medication today based on labs level

## 2013-10-23 NOTE — Assessment & Plan Note (Signed)
Will check rheumatoid panel Advised her to try aleve once daily for pain/inflammation

## 2013-10-24 LAB — RHEUMATOID FACTOR

## 2013-10-24 LAB — ANA: ANA: NEGATIVE

## 2014-02-22 ENCOUNTER — Other Ambulatory Visit: Payer: Self-pay | Admitting: Internal Medicine

## 2014-04-18 ENCOUNTER — Other Ambulatory Visit: Payer: Self-pay | Admitting: Internal Medicine

## 2014-04-18 DIAGNOSIS — E039 Hypothyroidism, unspecified: Secondary | ICD-10-CM

## 2014-04-23 ENCOUNTER — Other Ambulatory Visit (INDEPENDENT_AMBULATORY_CARE_PROVIDER_SITE_OTHER): Payer: 59

## 2014-04-23 ENCOUNTER — Other Ambulatory Visit: Payer: 59

## 2014-04-23 DIAGNOSIS — E039 Hypothyroidism, unspecified: Secondary | ICD-10-CM

## 2014-04-23 LAB — TSH: TSH: 2.15 u[IU]/mL (ref 0.35–4.50)

## 2014-04-23 LAB — T4, FREE: FREE T4: 1.12 ng/dL (ref 0.60–1.60)

## 2014-05-23 ENCOUNTER — Other Ambulatory Visit: Payer: Self-pay | Admitting: Internal Medicine

## 2014-06-21 ENCOUNTER — Other Ambulatory Visit: Payer: Self-pay | Admitting: Internal Medicine

## 2014-09-24 ENCOUNTER — Other Ambulatory Visit: Payer: Self-pay | Admitting: Internal Medicine

## 2014-09-24 ENCOUNTER — Telehealth: Payer: Self-pay | Admitting: *Deleted

## 2014-09-24 NOTE — Telephone Encounter (Signed)
Mammogram f/u call: called pt but no answer and no voicemail (kept ringing)

## 2014-10-16 ENCOUNTER — Other Ambulatory Visit: Payer: Self-pay | Admitting: Internal Medicine

## 2014-10-16 DIAGNOSIS — Z0000 Encounter for general adult medical examination without abnormal findings: Secondary | ICD-10-CM

## 2014-10-17 ENCOUNTER — Other Ambulatory Visit (INDEPENDENT_AMBULATORY_CARE_PROVIDER_SITE_OTHER): Payer: 59

## 2014-10-17 DIAGNOSIS — Z0000 Encounter for general adult medical examination without abnormal findings: Secondary | ICD-10-CM

## 2014-10-17 LAB — COMPREHENSIVE METABOLIC PANEL
ALT: 16 U/L (ref 0–35)
AST: 19 U/L (ref 0–37)
Albumin: 4.2 g/dL (ref 3.5–5.2)
Alkaline Phosphatase: 50 U/L (ref 39–117)
BUN: 12 mg/dL (ref 6–23)
CO2: 29 meq/L (ref 19–32)
CREATININE: 0.73 mg/dL (ref 0.40–1.20)
Calcium: 9.6 mg/dL (ref 8.4–10.5)
Chloride: 104 mEq/L (ref 96–112)
GFR: 86.31 mL/min (ref >60.00)
Glucose, Bld: 93 mg/dL (ref 70–99)
Potassium: 4.7 mEq/L (ref 3.5–5.1)
SODIUM: 140 meq/L (ref 135–145)
Total Bilirubin: 0.6 mg/dL (ref 0.2–1.2)
Total Protein: 7.2 g/dL (ref 6.0–8.3)

## 2014-10-17 LAB — CBC
HCT: 41 % (ref 36.0–46.0)
Hemoglobin: 13.5 g/dL (ref 12.0–15.0)
MCHC: 32.8 g/dL (ref 30.0–36.0)
MCV: 99.1 fl (ref 78.0–100.0)
Platelets: 263 10*3/uL (ref 150.0–400.0)
RBC: 4.13 Mil/uL (ref 3.87–5.11)
RDW: 13.1 % (ref 11.5–15.5)
WBC: 8.2 10*3/uL (ref 4.0–10.5)

## 2014-10-17 LAB — LIPID PANEL
CHOL/HDL RATIO: 2
Cholesterol: 178 mg/dL (ref 0–200)
HDL: 91.3 mg/dL (ref >39.00)
LDL CALC: 74 mg/dL (ref 0–99)
NONHDL: 86.71
Triglycerides: 62 mg/dL (ref 0.0–149.0)
VLDL: 12.4 mg/dL (ref 0.0–40.0)

## 2014-10-17 LAB — VITAMIN D 25 HYDROXY (VIT D DEFICIENCY, FRACTURES): VITD: 26.15 ng/mL — AB (ref 30.00–100.00)

## 2014-10-17 LAB — T4, FREE: Free T4: 1.04 ng/dL (ref 0.60–1.60)

## 2014-10-17 LAB — TSH: TSH: 2.25 u[IU]/mL (ref 0.35–4.50)

## 2014-10-31 ENCOUNTER — Ambulatory Visit (INDEPENDENT_AMBULATORY_CARE_PROVIDER_SITE_OTHER): Payer: 59 | Admitting: Internal Medicine

## 2014-10-31 ENCOUNTER — Encounter: Payer: Self-pay | Admitting: Internal Medicine

## 2014-10-31 VITALS — BP 120/78 | HR 75 | Temp 98.3°F | Ht 66.5 in | Wt 135.0 lb

## 2014-10-31 DIAGNOSIS — M810 Age-related osteoporosis without current pathological fracture: Secondary | ICD-10-CM | POA: Diagnosis not present

## 2014-10-31 DIAGNOSIS — J309 Allergic rhinitis, unspecified: Secondary | ICD-10-CM | POA: Diagnosis not present

## 2014-10-31 DIAGNOSIS — M129 Arthropathy, unspecified: Secondary | ICD-10-CM

## 2014-10-31 DIAGNOSIS — Z0000 Encounter for general adult medical examination without abnormal findings: Secondary | ICD-10-CM

## 2014-10-31 DIAGNOSIS — M19039 Primary osteoarthritis, unspecified wrist: Secondary | ICD-10-CM

## 2014-10-31 DIAGNOSIS — E039 Hypothyroidism, unspecified: Secondary | ICD-10-CM

## 2014-10-31 NOTE — Assessment & Plan Note (Signed)
Seasonal Controlled with Claritin prn with season changes

## 2014-10-31 NOTE — Assessment & Plan Note (Signed)
Vit D level from 1 week ago reviewed- borderline low Will start 2000 units of Vit D OTC Will repeat bone density next year

## 2014-10-31 NOTE — Assessment & Plan Note (Signed)
TSH and T4 from 1 week ago reviewed Will continue current dose of Synthroid

## 2014-10-31 NOTE — Patient Instructions (Signed)

## 2014-10-31 NOTE — Progress Notes (Signed)
Subjective:    Patient ID: Amber Soto, female    DOB: 1955-08-31, 59 y.o.   MRN: [id number]  HPI  Pt presents to the clinic today for her annual exam. She is also due for follow up of chronic conditions (see separate note).  Flu: wants one today Tetanus: She thinks it has been less than 10 years Zostovax: never, but would like one Pap Smear: 05/2013 Mammogram: 05/2013 Colon Screening: 2007 Vision Screening: yearly Dentist: biannually  Diet: She tries to avoid fried foods. She eats fruits and veggies daily. She drinks mostly water. Exercise: She joned the [employer], yoga, pilates and weight lifting   Review of Systems      Past Medical History  Diagnosis Date  . Unspecified hypothyroidism 02/25/2009  . ANEMIA-NOS 01/30/2007  . ALLERGIC RHINITIS 01/30/2007  . DIVERTICULUM, BLADDER 01/30/2007  . HEART MURMUR, HX OF 01/30/2007  . CAROTID BRUIT, RIGHT 01/30/2007  . UTI'S, HX OF 01/30/2007    Current Outpatient Prescriptions  Medication Sig Dispense Refill  . Cholecalciferol (VITAMIN D3) 2000 UNITS TABS Take 1 tablet by mouth daily.    Marland Kitchen denosumab (PROLIA) 60 MG/ML SOLN injection Inject 60 mg into the skin every 6 (six) months. Administer in upper arm, thigh, or abdomen    . estradiol (VAGIFEM) 25 MCG vaginal tablet Place 25 mcg vaginally 2 (two) times a week.    . levothyroxine (SYNTHROID, LEVOTHROID) 75 MCG tablet Take 1 tablet (75 mcg total) by mouth daily before breakfast. NEED TO SCHEDULE ANNUAL PHYSICAL FOR MORE REFILLS [phone] 90 tablet 0  . loratadine (CLARITIN) 10 MG tablet Take 10 mg by mouth as needed.      . naproxen sodium (ANAPROX) 220 MG tablet Take 220 mg by mouth 2 (two) times daily with a meal.    . pseudoephedrine (SUDAFED) 30 MG tablet Take 30 mg by mouth as needed.       No current facility-administered medications for this visit.    Allergies  Allergen Reactions  . Macrobid [Nitrofurantoin Monohyd Macro] Hives    Family History  Problem  Relation Age of Onset  . Hypertension Mother   . Thyroid disease Mother     hypothyroid  . Alcohol abuse Mother   . Stroke Mother   . Alcohol abuse Father   . Diabetes Other   . Cancer Maternal Aunt     bone cancer  . Arthritis Maternal Aunt     RA  . Arthritis Brother   . Depression Brother     Social History   Social History  . Marital Status: Married    Spouse Name: N/A  . Number of Children: 2  . Years of Education: 16   Occupational History  . Retail Cosmetic sales    Social History Main Topics  . Smoking status: Never Smoker   . Smokeless tobacco: Never Used  . Alcohol Use: 3.0 oz/week    6 drink(s) per week     Comment: occasional  . Drug Use: No  . Sexual Activity:    Partners: Male   Other Topics Concern  . 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 1 daughter 1985-married RN; 1 son 7 [place]-graduated, works [employer]-married '14. 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 SO in good health     Constitutional: Denies fever, malaise, fatigue, headache or abrupt weight changes.  HEENT: Denies eye pain, eye redness, ear pain, ringing in the ears, wax buildup, runny  nose, nasal congestion, bloody nose, or sore throat. Respiratory: Denies difficulty breathing, shortness of breath, cough or sputum production.   Cardiovascular: Denies chest pain, chest tightness, palpitations or swelling in the hands or feet.  Gastrointestinal: Denies abdominal pain, bloating, constipation, diarrhea or blood in the stool.  GU: Denies urgency, frequency, pain with urination, burning sensation, blood in urine, odor or discharge. Musculoskeletal: Pt reports wrist pain. Denies decrease in range of motion, difficulty with gait, muscle pain or joint swelling.  Skin: Denies redness, rashes, lesions or ulcercations.  Neurological: Denies dizziness, difficulty with memory, difficulty with speech or problems with balance and  coordination.  Psych: Denies anxiety, depression, SI/HI.  No other specific complaints in a complete review of systems (except as listed in HPI above).  Objective:   Physical Exam  BP 120/78 mmHg  Pulse 75  Temp(Src) 98.3 F (36.8 C) (Oral)  Ht 5' 6.5" (1.689 m)  Wt 135 lb (61.236 kg)  BMI 21.47 kg/m2  SpO2 98% Wt Readings from Last 3 Encounters:  10/31/14 135 lb (61.236 kg)  10/23/13 134 lb (60.782 kg)  09/11/12 132 lb (59.875 kg)    General: Appears her stated age, well developed, well nourished in NAD. Skin: Warm, dry and intact. No rashes, lesions or ulcerations noted. HEENT: Head: normal shape and size; Eyes: sclera white, no icterus, conjunctiva pink, PERRLA and EOMs intact; Ears: Tm's gray and intact, normal light reflex; Throat/Mouth: Teeth present, mucosa pink and moist, no exudate, lesions or ulcerations noted.  Neck:  Neck supple, trachea midline. No masses, lumps or thyromegaly present.  Cardiovascular: Normal rate and rhythm. S1,S2 noted.  Murmur noted. No rubs or gallops noted. No JVD or BLE edema. No carotid bruits noted. Pulmonary/Chest: Normal effort and positive vesicular breath sounds. No respiratory distress. No wheezes, rales or ronchi noted.  Abdomen: Soft and nontender. Normal bowel sounds, no bruits noted. No distention or masses noted. Liver, spleen and kidneys non palpable. Musculoskeletal: Normal range of motion. Strength 5/5 BUE/BLE.  Wrist joints enlarged but no signs of joint swelling. No difficulty with gait.  Neurological: Alert and oriented. Cranial nerves II-XII grossly intact. Coordination normal.  Psychiatric: Mood and affect normal. Behavior is normal. Judgment and thought content normal.     BMET    Component Value Date/Time   NA 140 10/17/2014 1105   K 4.7 10/17/2014 1105   CL 104 10/17/2014 1105   CO2 29 10/17/2014 1105   GLUCOSE 93 10/17/2014 1105   BUN 12 10/17/2014 1105   CREATININE 0.73 10/17/2014 1105   CALCIUM 9.6 10/17/2014  1105   GFRNONAA 79.23 01/27/2009 0916   GFRAA 97 01/23/2007 0959    Lipid Panel     Component Value Date/Time   CHOL 178 10/17/2014 1105   TRIG 62.0 10/17/2014 1105   HDL 91.30 10/17/2014 1105   CHOLHDL 2 10/17/2014 1105   VLDL 12.4 10/17/2014 1105   LDLCALC 74 10/17/2014 1105    CBC    Component Value Date/Time   WBC 8.2 10/17/2014 1105   RBC 4.13 10/17/2014 1105   HGB 13.5 10/17/2014 1105   HCT 41.0 10/17/2014 1105   PLT 263.0 10/17/2014 1105   MCV 99.1 10/17/2014 1105   MCHC 32.8 10/17/2014 1105   RDW 13.1 10/17/2014 1105   LYMPHSABS 2.3 10/05/2010 1154   MONOABS 0.4 10/05/2010 1154   EOSABS 0.2 10/05/2010 1154   BASOSABS 0.0 10/05/2010 1154    Hgb A1C No results found for: HGBA1C  Assessment & Plan:   Preventative Health Maintenance:  Flu shot today She insist that her Tetanus is UTD but she can not remember the year She was a shingles vaccine- will call insurance company to see where they cover it Encouraged her to continue to consume a health diet and exercise regimen She is past due for her mammogram- did encourage her to call and schedule this Pap Smear UTD Colonoscopy due next year Advised her to continue seeing an eye doctor and dentist annually CBC, CMET, Lipid from 1 week ago reviewed  RTC in 1 year for annual exam/followup  HPI  Pt reports annual follow up for chronic conditions.  Allergic Rhinitis: Worse in the spring. She takes Claritin as needed during the change of the seasons.  Hypothyroidism: She denies fatigue, depression, cold intolerance or constipation. She is taking her Synthroid as prescribed.  Osteopenia: She can not remember the date of her last bone density exam but did reports it showed some osteoperosis. She does take Prolia injections.  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 They do feel stiff at times. They do swell occasionally. She has noticed some weakness in her wrists. She takes Aleve as needed.  Review of Systems      Past Medical History  Diagnosis Date  . Unspecified hypothyroidism 02/25/2009  . ANEMIA-NOS 01/30/2007  . ALLERGIC RHINITIS 01/30/2007  . DIVERTICULUM, BLADDER 01/30/2007  . HEART MURMUR, HX OF 01/30/2007  . CAROTID BRUIT, RIGHT 01/30/2007  . UTI'S, HX OF 01/30/2007    Current Outpatient Prescriptions  Medication Sig Dispense Refill  . Cholecalciferol (VITAMIN D3) 2000 UNITS TABS Take 1 tablet by mouth daily.    Marland Kitchen denosumab (PROLIA) 60 MG/ML SOLN injection Inject 60 mg into the skin every 6 (six) months. Administer in upper arm, thigh, or abdomen    . estradiol (VAGIFEM) 25 MCG vaginal tablet Place 25 mcg vaginally 2 (two) times a week.    . levothyroxine (SYNTHROID, LEVOTHROID) 75 MCG tablet Take 1 tablet (75 mcg total) by mouth daily before breakfast. NEED TO SCHEDULE ANNUAL PHYSICAL FOR MORE REFILLS [phone] 90 tablet 0  . loratadine (CLARITIN) 10 MG tablet Take 10 mg by mouth as needed.      . naproxen sodium (ANAPROX) 220 MG tablet Take 220 mg by mouth 2 (two) times daily with a meal.    . pseudoephedrine (SUDAFED) 30 MG tablet Take 30 mg by mouth as needed.       No current facility-administered medications for this visit.    Allergies  Allergen Reactions  . Macrobid [Nitrofurantoin Monohyd Macro] Hives    Family History  Problem Relation Age of Onset  . Hypertension Mother   . Thyroid disease Mother     hypothyroid  . Alcohol abuse Mother   . Stroke Mother   . Alcohol abuse Father   . Diabetes Other   . Cancer Maternal Aunt     bone cancer  . Arthritis Maternal Aunt     RA  . Arthritis Brother   . Depression Brother     Social History   Social History  . Marital Status: Married    Spouse Name: N/A  . Number of Children: 2  . Years of Education: 16   Occupational History  . Retail Cosmetic sales    Social History Main Topics  . Smoking status: Never Smoker   . Smokeless tobacco: Never Used  . Alcohol Use: 3.0 oz/week    6 Standard drinks or  equivalent per week  Comment: occasional  . Drug Use: No  . Sexual Activity:    Partners: Male   Other Topics Concern  . 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 1 daughter 1985-married RN; 1 son 4 [place]-graduated, works [place]-married '14. 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 SO in good health     Constitutional: Denies fever, malaise, fatigue, headache or abrupt weight changes.  HEENT: Denies eye pain, eye redness, ear pain, ringing in the ears, wax buildup, runny nose, nasal congestion, bloody nose, or sore throat. Respiratory: Denies difficulty breathing, shortness of breath, cough or sputum production.   Cardiovascular: Denies chest pain, chest tightness, palpitations or swelling in the hands or feet.  Gastrointestinal: Denies abdominal pain, bloating, constipation, diarrhea or blood in the stool.  GU: Denies urgency, frequency, pain with urination, burning sensation, blood in urine, odor or discharge. Musculoskeletal:Pt reports wrist pain. Denies decrease in ROM, difficulty with gait, muscle pain or joint swelling.  Skin: Denies redness, rashes, lesions or ulcercations.  Neurological: Denies dizziness, difficulty with memory, difficulty with speech or problems with balance and coordination.  Psych: Denies anxiety, depression, SI/HI.  No other specific complaints in a complete review of systems (except as listed in HPI above).  Objective  BP 120/78 mmHg  Pulse 75  Temp(Src) 98.3 F (36.8 C) (Oral)  Ht 5' 6.5" (1.689 m)  Wt 135 lb (61.236 kg)  BMI 21.47 kg/m2  SpO2 98%  General: Appears her stated age, well developed, well nourished in NAD. Skin: Warm, dry and intact. No rashes, lesions or ulcerations noted. HEENT: Head: normal shape and size; Eyes: sclera white, no icterus, conjunctiva pink, PERRLA and EOMs intact; Ears: Tm's gray and intact, normal light reflex; Throat/Mouth: Teeth present,  mucosa pink and moist, no exudate, lesions or ulcerations noted.  Neck:  Neck supple, trachea midline. No masses, lumps or thyromegaly present.  Cardiovascular: Normal rate and rhythm. S1,S2 noted.  Murmur noted. No rubs or gallops noted. No JVD or BLE edema. No carotid bruits noted. Pulmonary/Chest: Normal effort and positive vesicular breath sounds. No respiratory distress. No wheezes, rales or ronchi noted.  Abdomen: Soft and nontender. Normal bowel sounds, no bruits noted. No distention or masses noted. Liver, spleen and kidneys non palpable. Musculoskeletal: Normal range of motion. Strength 5/5 BUE/BLE.  Wrist joints enlarged but no signs of joint swelling. No difficulty with gait.  Neurological: Alert and oriented. Cranial nerves II-XII grossly intact. Coordination normal.  Psychiatric: Mood and affect normal. Behavior is normal. Judgment and thought content normal.   Assessment and Plan:

## 2014-10-31 NOTE — Assessment & Plan Note (Signed)
Continue Aleve prn Consider wearing wrist brace when doing Yoga and Pilates

## 2014-10-31 NOTE — Progress Notes (Signed)
Pre visit review using our clinic review tool, if applicable. No additional management support is needed unless otherwise documented below in the visit note. 

## 2014-11-22 ENCOUNTER — Encounter: Payer: Self-pay | Admitting: Internal Medicine

## 2014-11-22 ENCOUNTER — Other Ambulatory Visit: Payer: Self-pay | Admitting: Internal Medicine

## 2014-11-22 MED ORDER — SULFAMETHOXAZOLE-TRIMETHOPRIM 800-160 MG PO TABS: 1.0000 | ORAL_TABLET | Freq: Two times a day (BID) | ORAL | Status: DC

## 2014-12-23 ENCOUNTER — Other Ambulatory Visit: Payer: Self-pay | Admitting: Internal Medicine

## 2015-08-20 ENCOUNTER — Encounter: Payer: Self-pay | Admitting: Gastroenterology

## 2015-10-10 ENCOUNTER — Ambulatory Visit (AMBULATORY_SURGERY_CENTER): Payer: Self-pay

## 2015-10-10 VITALS — Ht 66.5 in | Wt 138.0 lb

## 2015-10-10 DIAGNOSIS — Z1211 Encounter for screening for malignant neoplasm of colon: Secondary | ICD-10-CM

## 2015-10-10 MED ORDER — SUPREP BOWEL PREP KIT 17.5-3.13-1.6 GM/177ML PO SOLN: 1.0000 | Freq: Once | ORAL | 0 refills | Status: AC

## 2015-10-10 NOTE — Progress Notes (Signed)
No allergies to eggs or soy No past problems with anesthesia No diet meds No home oxygen  Has email and internet; declined emmi 

## 2015-10-14 ENCOUNTER — Encounter: Payer: Self-pay | Admitting: Gastroenterology

## 2015-10-24 ENCOUNTER — Ambulatory Visit (AMBULATORY_SURGERY_CENTER): Payer: 59 | Admitting: Gastroenterology

## 2015-10-24 ENCOUNTER — Encounter: Payer: Self-pay | Admitting: Gastroenterology

## 2015-10-24 VITALS — BP 123/62 | HR 58 | Temp 97.5°F | Resp 7 | Ht 66.5 in | Wt 138.0 lb

## 2015-10-24 DIAGNOSIS — Z1211 Encounter for screening for malignant neoplasm of colon: Secondary | ICD-10-CM

## 2015-10-24 MED ORDER — SODIUM CHLORIDE 0.9 % IV SOLN: 500.0000 mL | INTRAVENOUS | Status: DC

## 2015-10-24 NOTE — Op Note (Signed)
[facility] Patient Name: Amber Soto Procedure Date: 10/24/2015 1:26 PM MRN: [id number] Endoscopist: Napoleon Form , MD Age: 60 Referring MD:  Date of Birth: [birth year] Gender: Female Account #: [id number] Procedure:                Colonoscopy Indications:              Screening for colorectal malignant neoplasm, Last                            colonoscopy 10 years ago Medicines:                Monitored Anesthesia Care Procedure:                Pre-Anesthesia Assessment:                           - Prior to the procedure, a History and Physical                            was performed, and patient medications and                            allergies were reviewed. The patient's tolerance of                            previous anesthesia was also reviewed. The risks                            and benefits of the procedure and the sedation                            options and risks were discussed with the patient.                            All questions were answered, and informed consent                            was obtained. Prior Anticoagulants: The patient has                            taken no previous anticoagulant or antiplatelet                            agents. ASA Grade Assessment: II - A patient with                            mild systemic disease. After reviewing the risks                            and benefits, the patient was deemed in                            satisfactory condition to undergo the procedure.  After obtaining informed consent, the colonoscope                            was passed under direct vision. Throughout the                            procedure, the patient's blood pressure, pulse, and                            oxygen saturations were monitored continuously. The                            Model CF-HQ190L [id number]) scope was introduced                            through the anus and advanced  to the the terminal                            ileum, with identification of the appendiceal                            orifice and IC valve. The colonoscopy was performed                            without difficulty. The patient tolerated the                            procedure well. The quality of the bowel                            preparation was good. The terminal ileum, ileocecal                            valve, appendiceal orifice, and rectum were                            photographed. Scope In: 1:42:02 PM Scope Out: 1:58:04 PM Scope Withdrawal Time: 0 hours 9 minutes 52 seconds  Total Procedure Duration: 0 hours 16 minutes 2 seconds  Findings:                 The perianal and digital rectal examinations were                            normal.                           Multiple small and large-mouthed diverticula were                            found in the sigmoid colon and descending colon.                           Non-bleeding internal hemorrhoids were found during  retroflexion. The hemorrhoids were small.                           The exam was otherwise without abnormality. Complications:            No immediate complications. Estimated Blood Loss:     Estimated blood loss: none. Impression:               - Diverticulosis in the sigmoid colon and in the                            descending colon.                           - Non-bleeding internal hemorrhoids.                           - The examination was otherwise normal.                           - No specimens collected. Recommendation:           - Patient has a contact number available for                            emergencies. The signs and symptoms of potential                            delayed complications were discussed with the                            patient. Return to normal activities tomorrow.                            Written discharge instructions were provided to the                             patient.                           - Resume previous diet.                           - Continue present medications.                           - Repeat colonoscopy in 10 years for screening                            purposes.                           - Return to GI clinic PRN. Napoleon FormKavitha V. Nandigam, MD 10/24/2015 2:04:22 PM This report has been signed electronically.

## 2015-10-24 NOTE — Progress Notes (Signed)
To recovery, report to Mirts, RN, VSS. 

## 2015-10-24 NOTE — Patient Instructions (Signed)
YOU HAD AN ENDOSCOPIC PROCEDURE TODAY AT THE [facility]:   Refer to the procedure report that was given to you for any specific questions about what was found during the examination.  If the procedure report does not answer your questions, please call your gastroenterologist to clarify.  If you requested that your care partner not be given the details of your procedure findings, then the procedure report has been included in a sealed envelope for you to review at your convenience later.  YOU SHOULD EXPECT: Some feelings of bloating in the abdomen. Passage of more gas than usual.  Walking can help get rid of the air that was put into your GI tract during the procedure and reduce the bloating. If you had a lower endoscopy (such as a colonoscopy or flexible sigmoidoscopy) you may notice spotting of blood in your stool or on the toilet paper. If you underwent a bowel prep for your procedure, you may not have a normal bowel movement for a few days.  Please Note:  You might notice some irritation and congestion in your nose or some drainage.  This is from the oxygen used during your procedure.  There is no need for concern and it should clear up in a day or so.  SYMPTOMS TO REPORT IMMEDIATELY:   Following lower endoscopy (colonoscopy or flexible sigmoidoscopy):  Excessive amounts of blood in the stool  Significant tenderness or worsening of abdominal pains  Swelling of the abdomen that is new, acute  Fever of 100F or higher  For urgent or emergent issues, a gastroenterologist can be reached at any hour by calling [phone].   DIET:  We do recommend a small meal at first, but then you may proceed to your regular diet.  Drink plenty of fluids but you should avoid alcoholic beverages for 24 hours.  ACTIVITY:  You should plan to take it easy for the rest of today and you should NOT DRIVE or use heavy machinery until tomorrow (because of the sedation medicines used during the test).     FOLLOW UP: Our staff will call the number listed on your records the next business day following your procedure to check on you and address any questions or concerns that you may have regarding the information given to you following your procedure. If we do not reach you, we will leave a message.  However, if you are feeling well and you are not experiencing any problems, there is no need to return our call.  We will assume that you have returned to your regular daily activities without incident.  If any biopsies were taken you will be contacted by phone or by letter within the next 1-3 weeks.  Please call us at [phone] if you have not heard about the biopsies in 3 weeks.    SIGNATURES/CONFIDENTIALITY: You and/or your care partner have signed paperwork which will be entered into your electronic medical record.  These signatures attest to the fact that that the information above on your After Visit Summary has been reviewed and is understood.  Full responsibility of the confidentiality of this discharge information lies with you and/or your care-partner.  Diverticulosis, high fiber diet, hemorrhoids-handouts given  Repeat colonoscopy in 10 years 2027.  

## 2015-10-27 ENCOUNTER — Telehealth: Payer: Self-pay | Admitting: *Deleted

## 2015-10-27 NOTE — Telephone Encounter (Signed)
  Follow up Call-  Call back number 10/24/2015  Post procedure Call Back phone  # [phone]  Permission to leave phone message Yes  Some recent data might be hidden     Patient questions:  Do you have a fever, pain , or abdominal swelling? No. Pain Score  0 *  Have you tolerated food without any problems? Yes.    Have you been able to return to your normal activities? Yes.    Do you have any questions about your discharge instructions: Diet   No. Medications  No. Follow up visit  No.  Do you have questions or concerns about your Care? No.  Actions: * If pain score is 4 or above: No action needed, pain <4.

## 2015-11-04 ENCOUNTER — Other Ambulatory Visit: Payer: Self-pay | Admitting: Internal Medicine

## 2015-11-20 ENCOUNTER — Ambulatory Visit (INDEPENDENT_AMBULATORY_CARE_PROVIDER_SITE_OTHER): Payer: 59 | Admitting: Internal Medicine

## 2015-11-20 ENCOUNTER — Encounter: Payer: Self-pay | Admitting: Internal Medicine

## 2015-11-20 VITALS — BP 120/80 | HR 70 | Temp 98.7°F | Ht 66.75 in | Wt 139.0 lb

## 2015-11-20 DIAGNOSIS — Z114 Encounter for screening for human immunodeficiency virus [HIV]: Secondary | ICD-10-CM

## 2015-11-20 DIAGNOSIS — J302 Other seasonal allergic rhinitis: Secondary | ICD-10-CM | POA: Diagnosis not present

## 2015-11-20 DIAGNOSIS — Z2911 Encounter for prophylactic immunotherapy for respiratory syncytial virus (RSV): Secondary | ICD-10-CM

## 2015-11-20 DIAGNOSIS — E039 Hypothyroidism, unspecified: Secondary | ICD-10-CM | POA: Diagnosis not present

## 2015-11-20 DIAGNOSIS — Z1159 Encounter for screening for other viral diseases: Secondary | ICD-10-CM

## 2015-11-20 DIAGNOSIS — M19039 Primary osteoarthritis, unspecified wrist: Secondary | ICD-10-CM

## 2015-11-20 DIAGNOSIS — Z23 Encounter for immunization: Secondary | ICD-10-CM

## 2015-11-20 DIAGNOSIS — M810 Age-related osteoporosis without current pathological fracture: Secondary | ICD-10-CM

## 2015-11-20 DIAGNOSIS — Z0000 Encounter for general adult medical examination without abnormal findings: Secondary | ICD-10-CM | POA: Diagnosis not present

## 2015-11-20 LAB — COMPREHENSIVE METABOLIC PANEL
ALK PHOS: 52 U/L (ref 39–117)
ALT: 18 U/L (ref 0–35)
AST: 20 U/L (ref 0–37)
Albumin: 4.2 g/dL (ref 3.5–5.2)
BUN: 13 mg/dL (ref 6–23)
CO2: 29 mEq/L (ref 19–32)
Calcium: 9.2 mg/dL (ref 8.4–10.5)
Chloride: 105 mEq/L (ref 96–112)
Creatinine, Ser: 0.7 mg/dL (ref 0.40–1.20)
GFR: 90.26 mL/min (ref >60.00)
GLUCOSE: 104 mg/dL — AB (ref 70–99)
POTASSIUM: 4.8 meq/L (ref 3.5–5.1)
Sodium: 140 mEq/L (ref 135–145)
TOTAL PROTEIN: 7.1 g/dL (ref 6.0–8.3)
Total Bilirubin: 0.6 mg/dL (ref 0.2–1.2)

## 2015-11-20 LAB — CBC
HEMATOCRIT: 40.9 % (ref 36.0–46.0)
HEMOGLOBIN: 13.7 g/dL (ref 12.0–15.0)
MCHC: 33.5 g/dL (ref 30.0–36.0)
MCV: 97 fl (ref 78.0–100.0)
Platelets: 239 10*3/uL (ref 150.0–400.0)
RBC: 4.21 Mil/uL (ref 3.87–5.11)
RDW: 13.4 % (ref 11.5–15.5)
WBC: 7.7 10*3/uL (ref 4.0–10.5)

## 2015-11-20 LAB — LIPID PANEL
CHOL/HDL RATIO: 2
Cholesterol: 196 mg/dL (ref 0–200)
HDL: 104.5 mg/dL (ref >39.00)
LDL Cholesterol: 77 mg/dL (ref 0–99)
NONHDL: 91.55
Triglycerides: 72 mg/dL (ref 0.0–149.0)
VLDL: 14.4 mg/dL (ref 0.0–40.0)

## 2015-11-20 LAB — T4, FREE: Free T4: 1.1 ng/dL (ref 0.60–1.60)

## 2015-11-20 LAB — TSH: TSH: 1.98 u[IU]/mL (ref 0.35–4.50)

## 2015-11-20 NOTE — Assessment & Plan Note (Signed)
Will check TSH and T4 today Continue Synthroid Will adjust dose as needed if based on labs

## 2015-11-20 NOTE — Assessment & Plan Note (Signed)
Continue Claritin prn 

## 2015-11-20 NOTE — Patient Instructions (Signed)

## 2015-11-20 NOTE — Assessment & Plan Note (Signed)
Advised her to start taking Glucosamine-Chondroitin supplement daily Continue Aleve as needed

## 2015-11-20 NOTE — Assessment & Plan Note (Signed)
Continue Prolia

## 2015-11-20 NOTE — Progress Notes (Signed)
Subjective:    Patient ID: Amber Soto, female    DOB: Mar 03, 1955, 60 y.o.   MRN: [id number]  HPI  Pt presents to the clinic today for her annual exam. She is also due for follow up of chronic conditions.  Allergic Rhinitis: Worse in the spring. She takes Claritin as needed during the change of the seasons.  Hypothyroidism: She denies fatigue, depression, cold intolerance or constipation. She is taking her Synthroid as prescribed.  Osteoperosis: Last bone density exam in 2016, showed osteoperosis. She does take Prolia injections.  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 They do feel stiff at times. They do swell occasionally. She has noticed some weakness in her wrists. She takes Aleve as needed.  Flu: 10/2014 Tetanus: She thinks it has been less than 10 years Zostovax: never, but would like one today Pap Smear: 05/2014 Mammogram: 12/2014 Colon Screening: 10/2015 Vision Screening: yearly Dentist: biannually  Diet: She tries to avoid fried foods. She eats fruits and veggies daily. She drinks mostly water. Exercise: She joned the [employer], yoga, pilates and weight lifting   Review of Systems      Past Medical History:  Diagnosis Date  . ALLERGIC RHINITIS 01/30/2007  . Allergy   . ANEMIA-NOS 01/30/2007  . CAROTID BRUIT, RIGHT 01/30/2007  . DIVERTICULUM, BLADDER 01/30/2007  . HEART MURMUR, HX OF 01/30/2007  . Unspecified hypothyroidism 02/25/2009  . UTI'S, HX OF 01/30/2007    Current Outpatient Prescriptions  Medication Sig Dispense Refill  . Cholecalciferol (VITAMIN D3) 2000 UNITS TABS Take 1 tablet by mouth daily.    . ciprofloxacin (CIPRO) 500 MG tablet Take 500 mg by mouth as needed (after sex).    Marland Kitchen denosumab (PROLIA) 60 MG/ML SOLN injection Inject 60 mg into the skin every 6 (six) months. Administer in upper arm, thigh, or abdomen    . estradiol (VAGIFEM) 25 MCG vaginal tablet Place 25 mcg vaginally 2 (two) times a week.    . levothyroxine (SYNTHROID, LEVOTHROID) 75 MCG tablet  TAKE 1 TABLET EVERY DAY BEFORE BREAKFAST 90 tablet 0  . loratadine (CLARITIN) 10 MG tablet Take 10 mg by mouth as needed.      . naproxen sodium (ANAPROX) 220 MG tablet Take 220 mg by mouth 2 (two) times daily with a meal.    . pseudoephedrine (SUDAFED) 30 MG tablet Take 30 mg by mouth as needed.       Current Facility-Administered Medications  Medication Dose Route Frequency Provider Last Rate Last Dose  . 0.9 %  sodium chloride infusion  500 mL Intravenous Continuous Napoleon Form, MD        Allergies  Allergen Reactions  . Macrobid [Nitrofurantoin Monohyd Macro] Hives    Family History  Problem Relation Age of Onset  . Hypertension Mother   . Thyroid disease Mother     hypothyroid  . Alcohol abuse Mother   . Stroke Mother   . Alcohol abuse Father   . Arthritis Brother   . Depression Brother   . Colon polyps Brother   . Diabetes Other   . Cancer Maternal Aunt     bone cancer  . Arthritis Maternal Aunt     RA  . Colon cancer Neg Hx     Social History   Social History  . Marital status: Married    Spouse name: N/A  . Number of children: 2  . Years of education: 16   Occupational History  . Retail Cosmetic sales    Social History Main Topics  .  Smoking status: Never Smoker  . Smokeless tobacco: Never Used  . Alcohol use 3.0 oz/week    6 Standard drinks or equivalent per week     Comment: occasional  . Drug use: No  . Sexual activity: Yes    Partners: Male   Other Topics Concern  . 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 Married 1979. 1 daughter 1985-married RN; 1 son [id number] [place]-graduated, works [place]-married '14. 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 Marriage in good health. SO in good health     Constitutional: Denies fever, malaise, fatigue, headache or abrupt weight changes.  HEENT: Denies eye pain, eye redness, ear pain, ringing in the ears, wax buildup, runny nose, nasal congestion, bloody nose, or sore  throat. Respiratory: Denies difficulty breathing, shortness of breath, cough or sputum production.   Cardiovascular: Denies chest pain, chest tightness, palpitations or swelling in the hands or feet.  Gastrointestinal: Denies abdominal pain, bloating, constipation, diarrhea or blood in the stool.  GU: Denies urgency, frequency, pain with urination, burning sensation, blood in urine, odor or discharge. Musculoskeletal: Pt reports occasional wrist pain. Denies decrease in range of motion, difficulty with gait, muscle pain or joint swelling.  Skin: Denies redness, rashes, lesions or ulcercations.  Neurological: Denies dizziness, difficulty with memory, difficulty with speech or problems with balance and coordination.  Psych: Denies anxiety, depression, SI/HI.  No other specific complaints in a complete review of systems (except as listed in HPI above).  Objective:   Physical Exam  BP 120/80   Pulse 70   Temp 98.7 F (37.1 C) (Oral)   Ht 5' 6.75" (1.695 m)   Wt 139 lb (63 kg)   SpO2 98%   BMI 21.93 kg/m   Wt Readings from Last 3 Encounters:  10/24/15 138 lb (62.6 kg)  10/10/15 138 lb (62.6 kg)  10/31/14 135 lb (61.2 kg)    General: Appears her stated age, well developed, well nourished in NAD. Skin: Warm, dry and intact. No rashes, lesions or ulcerations noted. HEENT: Head: normal shape and size; Eyes: sclera white, no icterus, conjunctiva pink, PERRLA and EOMs intact; Ears: Tm's gray and intact, normal light reflex; Throat/Mouth: Teeth present, mucosa pink and moist, no exudate, lesions or ulcerations noted.  Neck:  Neck supple, trachea midline. No masses, lumps or thyromegaly present.  Cardiovascular: Normal rate and rhythm. S1,S2 noted.  Murmur noted. No rubs or gallops noted. No JVD or BLE edema. No carotid bruits noted. Pulmonary/Chest: Normal effort and positive vesicular breath sounds. No respiratory distress. No wheezes, rales or ronchi noted.  Abdomen: Soft and nontender.  Normal bowel sounds. No distention or masses noted. Liver, spleen and kidneys non palpable. Musculoskeletal: Normal range of motion. Strength 5/5 BUE/BLE.  Wrist joints enlarged but no signs of joint swelling. No difficulty with gait.  Neurological: Alert and oriented. Cranial nerves II-XII grossly intact. Coordination normal.  Psychiatric: Mood and affect normal. Behavior is normal. Judgment and thought content normal.     BMET    Component Value Date/Time   NA 140 10/17/2014 1105   K 4.7 10/17/2014 1105   CL 104 10/17/2014 1105   CO2 29 10/17/2014 1105   GLUCOSE 93 10/17/2014 1105   BUN 12 10/17/2014 1105   CREATININE 0.73 10/17/2014 1105   CALCIUM 9.6 10/17/2014 1105   GFRNONAA 79.23 01/27/2009 0916   GFRAA 97 01/23/2007 0959    Lipid Panel     Component Value Date/Time   CHOL 178  10/17/2014 1105   TRIG 62.0 10/17/2014 1105   HDL 91.30 10/17/2014 1105   CHOLHDL 2 10/17/2014 1105   VLDL 12.4 10/17/2014 1105   LDLCALC 74 10/17/2014 1105    CBC    Component Value Date/Time   WBC 8.2 10/17/2014 1105   RBC 4.13 10/17/2014 1105   HGB 13.5 10/17/2014 1105   HCT 41.0 10/17/2014 1105   PLT 263.0 10/17/2014 1105   MCV 99.1 10/17/2014 1105   MCHC 32.8 10/17/2014 1105   RDW 13.1 10/17/2014 1105   LYMPHSABS 2.3 10/05/2010 1154   MONOABS 0.4 10/05/2010 1154   EOSABS 0.2 10/05/2010 1154   BASOSABS 0.0 10/05/2010 1154    Hgb A1C No results found for: HGBA1C       Assessment & Plan:   Preventative Health Maintenance:  Flu shot and Zostovax today She insist that her Tetanus is UTD but she can not remember the year Encouraged her to continue to consume a healthy diet and exercise regimen Pap smear, mammogram and colonoscopy UTD  Advised her to continue seeing an eye doctor and dentist annually CBC, CMET, Lipid HIV and Hep C today  RTC in 1 year for annual exam/followup  Nicki Reaper, NP

## 2015-11-21 LAB — HEPATITIS C ANTIBODY: HCV AB: NEGATIVE

## 2015-11-21 LAB — HIV ANTIBODY (ROUTINE TESTING W REFLEX): HIV 1&2 Ab, 4th Generation: NONREACTIVE

## 2015-12-03 NOTE — Addendum Note (Signed)
Addended by: Roena MaladyEVONTENNO, Anakin Varkey Y on: 12/03/2015 11:15 AM   Modules accepted: Orders

## 2016-03-22 ENCOUNTER — Other Ambulatory Visit: Payer: Self-pay | Admitting: Internal Medicine

## 2016-05-05 ENCOUNTER — Other Ambulatory Visit: Payer: Self-pay

## 2016-05-05 MED ORDER — LEVOTHYROXINE SODIUM 75 MCG PO TABS: 75.0000 ug | ORAL_TABLET | Freq: Every day | ORAL | 1 refills | Status: DC

## 2016-05-13 MED ORDER — LEVOTHYROXINE SODIUM 75 MCG PO TABS: 75.0000 ug | ORAL_TABLET | Freq: Every day | ORAL | 0 refills | Status: DC

## 2016-05-13 NOTE — Addendum Note (Signed)
Addended by: Roena MaladyEVONTENNO, Elyse Prevo Y on: 05/13/2016 05:08 PM   Modules accepted: Orders

## 2016-05-25 ENCOUNTER — Other Ambulatory Visit: Payer: Self-pay | Admitting: Internal Medicine

## 2016-12-14 ENCOUNTER — Ambulatory Visit (INDEPENDENT_AMBULATORY_CARE_PROVIDER_SITE_OTHER): Payer: Managed Care, Other (non HMO) | Admitting: Family Medicine

## 2016-12-14 ENCOUNTER — Encounter: Payer: Self-pay | Admitting: Family Medicine

## 2016-12-14 ENCOUNTER — Ambulatory Visit (INDEPENDENT_AMBULATORY_CARE_PROVIDER_SITE_OTHER)
Admission: RE | Admit: 2016-12-14 | Discharge: 2016-12-14 | Disposition: A | Discharge status: Home / Self Care | Payer: Managed Care, Other (non HMO) | Source: Ambulatory Visit | Attending: Family Medicine | Admitting: Family Medicine

## 2016-12-14 VITALS — BP 128/74 | HR 87 | Temp 98.2°F

## 2016-12-14 DIAGNOSIS — S52532A Colles' fracture of left radius, initial encounter for closed fracture: Secondary | ICD-10-CM | POA: Insufficient documentation

## 2016-12-14 DIAGNOSIS — S52532D Colles' fracture of left radius, subsequent encounter for closed fracture with routine healing: Secondary | ICD-10-CM

## 2016-12-14 NOTE — Patient Instructions (Signed)
Thank you for coming in,   Please follow up with me in 2 weeks   Please elevate the hand, ice and tylenol for pain.   We will call you with the results from today.    Please feel free to call with any questions or concerns at any time, at [phone]. --Dr. Jordan LikesSchmitz

## 2016-12-14 NOTE — Progress Notes (Addendum)
Amber Soto - 61 y.o. female MRN [id number]  Date of birth: December 28, 1955  SUBJECTIVE:  Including CC & ROS.  Chief Complaint  Patient presents with  . Wrist Injury    tripped and fell at [employer] landed on wrist on 12/10/16. Patient has been taking Aleve for pain.    Amber Soto is a 61 y.o. female that isPresenting with left wrist fracture. She fell while she was on a [employer] onto her hand. This occurred a few days ago. She was seen by her primary doctor and I have reviewed her notes. She was placed in a cock-up splint and has done well with that. She does have swelling and bruising of her left wrist and forearm. She denies any numbness or tingling. Has been taking Advil for the pain. She reports having a history of osteoporosis..  Independent review of the left hand and wrist x-ray from 10/27 shows impaction of the distal radius with minimal displacement.  Review of her bone density from 2013 shows osteopenia   Review of Systems  Constitutional: Negative for fever.  Musculoskeletal: Negative for gait problem.  Skin: Positive for color change.  Neurological: Negative for numbness.    HISTORY: Past Medical, Surgical, Social, and Family History Reviewed & Updated per EMR.   Pertinent Historical Findings include:  Past Medical History:  Diagnosis Date  . ALLERGIC RHINITIS 01/30/2007  . Allergy   . ANEMIA-NOS 01/30/2007  . CAROTID BRUIT, RIGHT 01/30/2007  . DIVERTICULUM, BLADDER 01/30/2007  . HEART MURMUR, HX OF 01/30/2007  . Unspecified hypothyroidism 02/25/2009  . UTI'S, HX OF 01/30/2007    Past Surgical History:  Procedure Laterality Date  . COLONOSCOPY     10 yrs ago- normal  . none      Allergies  Allergen Reactions  . Macrobid [Nitrofurantoin Monohyd Macro] Hives    Family History  Problem Relation Age of Onset  . Hypertension Mother   . Thyroid disease Mother        hypothyroid  . Alcohol abuse Mother   . Stroke Mother   . Alcohol abuse Father   . Arthritis  Brother   . Depression Brother   . Colon polyps Brother   . Diabetes Other   . Cancer Maternal Aunt        bone cancer  . Arthritis Maternal Aunt        RA  . Colon cancer Neg Hx      Social History   Social History  . Marital status: Married    Spouse name: N/A  . Number of children: 2  . Years of education: 16   Occupational History  . Retail Cosmetic sales    Social History Main Topics  . Smoking status: Never Smoker  . Smokeless tobacco: Never Used  . Alcohol use 3.0 oz/week    6 Standard drinks or equivalent per week     Comment: occasional  . Drug use: No  . Sexual activity: Yes    Partners: Male   Other Topics Concern  . 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 Married 1979. 1 daughter 1985-married RN; 1 son [id number] [place]-graduated, works [place]-married '14. 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 Marriage in good health. SO in good health     PHYSICAL EXAM:  VS: BP 128/74 (BP Location: Left Arm, Patient Position: Sitting, Cuff Size: Normal)   Pulse 87   Temp 98.2 F (36.8 C) (Oral)   SpO2 98%  Physical Exam Gen: NAD,  alert, cooperative with exam, well-appearing ENT: normal lips, normal nasal mucosa,  Eye: normal EOM, normal conjunctiva and lids CV:  no edema, +2 pedal pulses   Resp: no accessory muscle use, non-labored,  Skin: no rashes, no areas of induration  Neuro: normal tone, normal sensation to touch Psych:  normal insight, alert and oriented MSK:  Left wrist: Obvious swelling of the hand. Bruising of the forearm. Tenderness to palpation of the distal radius. Limited range of motion of flexion and extension secondary to pain. Normal sensation to touch Normal thumb extension and opposition. Normal finger flexion and extension. Normal finger abduction and abduction. Neurovascularly intact          ASSESSMENT & PLAN:   Fracture, Colles, left, closed Review of her x-ray shows a nondisplaced fracture of  the distal radius. Not having any neurovascular compromise - Continue the wrist splint - Follow-up in 2 weeks and reimage at that time - X-rays today

## 2016-12-14 NOTE — Assessment & Plan Note (Signed)
Review of her x-ray shows a nondisplaced fracture of the distal radius. Not having any neurovascular compromise - Continue the wrist splint - Follow-up in 2 weeks and reimage at that time - X-rays today

## 2016-12-15 ENCOUNTER — Telehealth: Payer: Self-pay | Admitting: Family Medicine

## 2016-12-15 NOTE — Telephone Encounter (Signed)
Informed patient of xray results.  Myra RudeSchmitz, Jeremy E, MD [facility] 12/15/2016, 8:59 AM

## 2016-12-19 ENCOUNTER — Encounter: Payer: Self-pay | Admitting: Internal Medicine

## 2016-12-23 ENCOUNTER — Encounter: Payer: 59 | Admitting: Internal Medicine

## 2016-12-24 ENCOUNTER — Ambulatory Visit: Payer: Managed Care, Other (non HMO) | Admitting: Family Medicine

## 2016-12-24 ENCOUNTER — Ambulatory Visit (INDEPENDENT_AMBULATORY_CARE_PROVIDER_SITE_OTHER)
Admission: RE | Admit: 2016-12-24 | Discharge: 2016-12-24 | Disposition: A | Discharge status: Home / Self Care | Payer: Managed Care, Other (non HMO) | Source: Ambulatory Visit | Attending: Family Medicine | Admitting: Family Medicine

## 2016-12-24 ENCOUNTER — Encounter: Payer: Self-pay | Admitting: Family Medicine

## 2016-12-24 VITALS — BP 122/74 | HR 66 | Temp 98.1°F | Ht 66.75 in | Wt 144.0 lb

## 2016-12-24 DIAGNOSIS — S52532D Colles' fracture of left radius, subsequent encounter for closed fracture with routine healing: Secondary | ICD-10-CM

## 2016-12-24 NOTE — Progress Notes (Signed)
Amber Soto - 61 y.o. female MRN [id number]  Date of birth: 08-18-1955  SUBJECTIVE:  Including CC & ROS.  Chief Complaint  Patient presents with  . Follow-up    Left writst fracture-she states still tender at times    [name] is a 61 y.o. female that is his following up for a left distal radius fracture.  She has been using a wrist splint pain.  Has had improvement of her swelling.  Still has resolving.  Has been taken Tylenol for the pain..  Independent review of the left wrist x-ray from 10/30 shows impaction of the.  Independent review of the left hand and wrist x-ray from 10/27 shows impaction of the distal radius with minimal displacement.  Review of her bone density from 2013 shows osteopenia   Review of Systems  Constitutional: Negative for fever.  Musculoskeletal: Negative for gait problem, joint swelling and myalgias.  Skin: Positive for color change.  Neurological: Negative for weakness and numbness.  Hematological: Negative for adenopathy.    HISTORY: Past Medical, Surgical, Social, and Family History Reviewed & Updated per EMR.   Pertinent Historical Findings include:  Past Medical History:  Diagnosis Date  . ALLERGIC RHINITIS 01/30/2007  . Allergy   . ANEMIA-NOS 01/30/2007  . CAROTID BRUIT, RIGHT 01/30/2007  . DIVERTICULUM, BLADDER 01/30/2007  . HEART MURMUR, HX OF 01/30/2007  . Unspecified hypothyroidism 02/25/2009  . UTI'S, HX OF 01/30/2007    Past Surgical History:  Procedure Laterality Date  . COLONOSCOPY     10 yrs ago- normal  . none      Allergies  Allergen Reactions  . Macrobid [Nitrofurantoin Monohyd Macro] Hives    Family History  Problem Relation Age of Onset  . Hypertension Mother   . Thyroid disease Mother        hypothyroid  . Alcohol abuse Mother   . Stroke Mother   . Alcohol abuse Father   . Arthritis Brother   . Depression Brother   . Colon polyps Brother   . Diabetes Other   . Cancer Maternal Aunt        bone cancer   . Arthritis Maternal Aunt        RA  . Colon cancer Neg Hx      Social History   Socioeconomic History  . Marital status: Married    Spouse name: Not on file  . Number of children: 2  . Years of education: 7416  . Highest education level: Not on file  Social Needs  . Financial resource strain: Not on file  . Food insecurity - worry: Not on file  . Food insecurity - inability: Not on file  . Transportation needs - medical: Not on file  . Transportation needs - non-medical: Not on file  Occupational History  . 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 Smoking status: Never Smoker  . Smokeless tobacco: Never Used  Substance and Sexual Activity  . Alcohol use: Yes    Alcohol/week: 3.0 oz    Types: 6 Standard drinks or equivalent per week    Comment: occasional  . Drug use: No  . Sexual activity: Yes    Partners: Male  Other Topics Concern  . 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 Married 1979. 1 daughter 1985-married RN; 1 son [id number] [place]-graduated, works [employer]-married '14. 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 Marriage in good health. SO in good health     PHYSICAL EXAM:  VS: BP 122/74 (BP Location: Right Arm, Patient Position: Sitting, Cuff Size: Normal)   Pulse 66   Temp 98.1 F (36.7 C) (Oral)   Ht 5' 6.75" (1.695 m)   Wt 144 lb (65.3 kg)   SpO2 98%   BMI 22.72 kg/m  Physical Exam Gen: NAD, alert, cooperative with exam, well-appearing ENT: normal lips, normal nasal mucosa,  Eye: normal EOM, normal conjunctiva and lids CV:  no edema, +2 pedal pulses   Resp: no accessory muscle use, non-labored,  Skin: no rashes, no areas of induration  Neuro: normal tone, normal sensation to touch Psych:  normal insight, alert and oriented MSK:  Left wrist: Symptoms palpation of the distal radius. Ongoing ecchymosis. Normal range of motion. Normal strength with grip. Normal pincer grasp. Normal finger abduction and abduction  strength to resistance. Normal thumb opposition. Normal thumb flexion and extension. Normal elbow range of motion. Neurovascularly intact  Limited ultrasound: Left wrist and elbow:  Normal appearance of the common extensor tendons at the lateral condyle. Normal-appearing shaft of the ulna. Normal joint space at the elbow.  Healing distal radius fracture with some callus formation  Summary: Routine healing observed of the distal radius fracture  Ultrasound and interpretation by Clare GandyJeremy Schmitz, MD           ASSESSMENT & PLAN:   Fracture, Colles, left, closed Healing is present on ultrasound. Having good range of motion. Independent review of the x-ray shows ongoing healing. She is a little over 2 weeks from her initial injury. - She'll follow-up in 2 weeks. At that point we'll try to wean her out of the splint and initiate physical therapy. - Initiate a vitamin D today.

## 2016-12-25 MED ORDER — VITAMIN D (ERGOCALCIFEROL) 1.25 MG (50000 UNIT) PO CAPS: 50000.0000 [IU] | ORAL_CAPSULE | ORAL | 0 refills | Status: DC

## 2016-12-25 NOTE — Assessment & Plan Note (Signed)
Healing is present on ultrasound. Having good range of motion. Independent review of the x-ray shows ongoing healing. She is a little over 2 weeks from her initial injury. - She'll follow-up in 2 weeks. At that point we'll try to wean her out of the splint and initiate physical therapy. - Initiate a vitamin D today.

## 2016-12-31 ENCOUNTER — Encounter: Payer: Self-pay | Admitting: Internal Medicine

## 2016-12-31 ENCOUNTER — Ambulatory Visit (INDEPENDENT_AMBULATORY_CARE_PROVIDER_SITE_OTHER): Payer: Managed Care, Other (non HMO) | Admitting: Internal Medicine

## 2016-12-31 VITALS — BP 124/78 | HR 68 | Temp 98.5°F | Ht 66.5 in | Wt 141.0 lb

## 2016-12-31 DIAGNOSIS — M810 Age-related osteoporosis without current pathological fracture: Secondary | ICD-10-CM | POA: Diagnosis not present

## 2016-12-31 DIAGNOSIS — Z0001 Encounter for general adult medical examination with abnormal findings: Secondary | ICD-10-CM

## 2016-12-31 DIAGNOSIS — E039 Hypothyroidism, unspecified: Secondary | ICD-10-CM | POA: Diagnosis not present

## 2016-12-31 DIAGNOSIS — Z13220 Encounter for screening for lipoid disorders: Secondary | ICD-10-CM | POA: Diagnosis not present

## 2016-12-31 NOTE — Progress Notes (Signed)
Subjective:    Patient ID: [name], female    DOB: [birth year], 61 y.o.   MRN: [id number]  HPI  Pt presents to the clinic today for her annual exam.   Hypothyroidism: She denies any issues on her current dose of Synthroid. Her las TSH from 11/2015 was normal.   Osteoporosis: She is taking Prolia. Bone density from 11/2011 reviewed. She is getting at least 30 minutes of weight bearing activity daily.  Flu: 11/2015 Tetanus: < 10 years Shingles Vaccine: 11/2015 Mammogram: 12/2013 Pap Smear: 12/2015, [place] [facility] Bone Density: 2015 Colon Screening: 10/2015 Vision Screening: yearly Dentist: biannually  Diet: She does eat meat. She consumes fruits and veggies daily. She avoids fried foods. She drinks mostly water. Exercise: Walking 3-4 times per week.  Review of Systems  Past Medical History:  Diagnosis Date  . ALLERGIC RHINITIS 01/30/2007  . Allergy   . ANEMIA-NOS 01/30/2007  . CAROTID BRUIT, RIGHT 01/30/2007  . DIVERTICULUM, BLADDER 01/30/2007  . HEART MURMUR, HX OF 01/30/2007  . Unspecified hypothyroidism 02/25/2009  . UTI'S, HX OF 01/30/2007    Current Outpatient Medications  Medication Sig Dispense Refill  . Cholecalciferol (VITAMIN D3) 2000 UNITS TABS Take 1 tablet by mouth daily.    . ciprofloxacin (CIPRO) 500 MG tablet Take 500 mg by mouth as needed (after sex).    Marland Kitchen denosumab (PROLIA) 60 MG/ML SOLN injection Inject 60 mg into the skin every 6 (six) months. Administer in upper arm, thigh, or abdomen    . estradiol (VAGIFEM) 25 MCG vaginal tablet Place 25 mcg vaginally 2 (two) times a week.    . levothyroxine (SYNTHROID, LEVOTHROID) 75 MCG tablet TAKE 1 TABLET BY MOUTH  DAILY BEFORE BREAKFAST 90 tablet 1  . loratadine (CLARITIN) 10 MG tablet Take 10 mg by mouth as needed.      . naproxen sodium (ANAPROX) 220 MG tablet Take 220 mg by mouth 2 (two) times daily with a meal.    . pseudoephedrine (SUDAFED) 30 MG tablet Take 30 mg by mouth as needed.      . Vitamin D,  Ergocalciferol, (DRISDOL) 50000 units CAPS capsule Take 1 capsule (50,000 Units total) every 7 (seven) days by mouth. Take for 8 total doses(weeks) 8 capsule 0   Current Facility-Administered Medications  Medication Dose Route Frequency Provider Last Rate Last Dose  . 0.9 %  sodium chloride infusion  500 mL Intravenous Continuous Nandigam, Eleonore Chiquito, MD        Allergies  Allergen Reactions  . Macrobid [Nitrofurantoin Monohyd Macro] Hives    Family History  Problem Relation Age of Onset  . Hypertension Mother   . Thyroid disease Mother        hypothyroid  . Alcohol abuse Mother   . Stroke Mother   . Alcohol abuse Father   . Arthritis Brother   . Depression Brother   . Colon polyps Brother   . Diabetes Other   . Cancer Maternal Aunt        bone cancer  . Arthritis Maternal Aunt        RA  . Colon cancer Neg Hx     Social History   Socioeconomic History  . Marital status: Married    Spouse name: Not on file  . Number of children: 2  . Years of education: 13  . Highest education level: Not on file  Social Needs  . Financial resource strain: Not on file  . Food insecurity - worry: Not on file  .  Food insecurity - inability: Not on file  . Transportation needs - medical: Not on file  . Transportation needs - non-medical: Not on file  Occupational History  . 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 Smoking status: Never Smoker  . Smokeless tobacco: Never Used  Substance and Sexual Activity  . Alcohol use: Yes    Alcohol/week: 3.0 oz    Types: 6 Standard drinks or equivalent per week    Comment: occasional  . Drug use: No  . Sexual activity: Yes    Partners: Male  Other Topics Concern  . 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 1 daughter 1985-married RN; 1 son [id number] [place]-graduated, works [place]-married '14. 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 Marriage in good health. SO in good health      Constitutional: Denies fever, malaise, fatigue, headache or abrupt weight changes.  HEENT: Denies eye pain, eye redness, ear pain, ringing in the ears, wax buildup, runny nose, nasal congestion, bloody nose, or sore throat. Respiratory: Denies difficulty breathing, shortness of breath, cough or sputum production.   Cardiovascular: Denies chest pain, chest tightness, palpitations or swelling in the hands or feet.  Gastrointestinal: Denies abdominal pain, bloating, constipation, diarrhea or blood in the stool.  GU: Denies urgency, frequency, pain with urination, burning sensation, blood in urine, odor or discharge. Musculoskeletal: Pt reports left wrist pain (recently fractured, in brace). Denies decrease in range of motion, difficulty with gait, muscle pain or joint pain and swelling.  Skin: Denies redness, rashes, lesions or ulcercations.  Neurological: Denies dizziness, difficulty with memory, difficulty with speech or problems with balance and coordination.  Psych: Denies anxiety, depression, SI/HI.  No other specific complaints in a complete review of systems (except as listed in HPI above).     Objective:   Physical Exam  BP 124/78   Pulse 68   Temp 98.5 F (36.9 C) (Oral)   Ht 5' 6.5" (1.689 m)   Wt 141 lb (64 kg)   SpO2 98%   BMI 22.42 kg/m  Wt Readings from Last 3 Encounters:  12/31/16 141 lb (64 kg)  12/24/16 144 lb (65.3 kg)  11/20/15 139 lb (63 kg)    General: Appears her stated age, well developed, well nourished in NAD. Skin: Warm, dry and intact. No rashes, lesions or ulcerations noted. HEENT: Head: normal shape and size; Eyes: sclera white, no icterus, conjunctiva pink, PERRLA and EOMs intact; Ears: Tm's gray and intact, normal light reflex;Throat/Mouth: Teeth present, mucosa pink and moist, no exudate, lesions or ulcerations noted.  Neck:  Neck supple, trachea midline. No masses, lumps or thyromegaly present.  Cardiovascular: Normal rate and rhythm. S1,S2  noted.  Murmur noted. No JVD or BLE edema. Pulmonary/Chest: Normal effort and positive vesicular breath sounds. No respiratory distress. No wheezes, rales or ronchi noted.  Abdomen: Soft and nontender. Normal bowel sounds. No distention or masses noted. Liver, spleen and kidneys non palpable. Musculoskeletal: Strength 5/5 BUE/BLE.Marland Kitchen. No difficulty with gait.  Neurological: Alert and oriented. Cranial nerves II-XII grossly intact. Coordination normal.  Psychiatric: Mood and affect normal. Behavior is normal. Judgment and thought content normal.    BMET    Component Value Date/Time   NA 139 12/31/2016 1600   K 3.9 12/31/2016 1600   CL 106 12/31/2016 1600   CO2 23 12/31/2016 1600   GLUCOSE 91 12/31/2016 1600   BUN 15 12/31/2016 1600   CREATININE 0.70 12/31/2016 1600   CALCIUM 9.3  12/31/2016 1600   GFRNONAA 79.23 01/27/2009 0916   GFRAA 97 01/23/2007 0959    Lipid Panel     Component Value Date/Time   CHOL 185 12/31/2016 1600   TRIG 124 12/31/2016 1600   HDL 104 12/31/2016 1600   CHOLHDL 1.8 12/31/2016 1600   VLDL 14.4 11/20/2015 1118   LDLCALC 77 11/20/2015 1118    CBC    Component Value Date/Time   WBC 8.3 12/31/2016 1600   RBC 3.86 12/31/2016 1600   HGB 12.8 12/31/2016 1600   HCT 36.9 12/31/2016 1600   PLT 276 12/31/2016 1600   MCV 95.6 12/31/2016 1600   MCH 33.2 (H) 12/31/2016 1600   MCHC 34.7 12/31/2016 1600   RDW 11.7 12/31/2016 1600   LYMPHSABS 2,689 12/31/2016 1600   MONOABS 0.4 10/05/2010 1154   EOSABS 340 12/31/2016 1600   BASOSABS 33 12/31/2016 1600    Hgb A1C No results found for: HGBA1C          Assessment & Plan:   Preventative Health Maintenance:  Flu and tetanus UTD Pap smear and mammogram UTD Colon screening UTD Encouraged her to consume a balanced diet and exercise regimen Advised her to see an eye doctor and dentist annually Will check CBC, CMET, Lipid, TSH, Free T4 and Vit D today  RTC in 1 year, sooner if needed Nicki ReaperBAITY, Malcom Selmer,  NP

## 2016-12-31 NOTE — Patient Instructions (Signed)
Health Maintenance for Postmenopausal Women Menopause is a normal process in which your reproductive ability comes to an end. This process happens gradually over a span of months to years, usually between the ages of 22 and 9. Menopause is complete when you have missed 12 consecutive menstrual periods. It is important to talk with your health care provider about some of the most common conditions that affect postmenopausal women, such as heart disease, cancer, and bone loss (osteoporosis). Adopting a healthy lifestyle and getting preventive care can help to promote your health and wellness. Those actions can also lower your chances of developing some of these common conditions. What should I know about menopause? During menopause, you may experience a number of symptoms, such as:  Moderate-to-severe hot flashes.  Night sweats.  Decrease in sex drive.  Mood swings.  Headaches.  Tiredness.  Irritability.  Memory problems.  Insomnia.  Choosing to treat or not to treat menopausal changes is an individual decision that you make with your health care provider. What should I know about hormone replacement therapy and supplements? Hormone therapy products are effective for treating symptoms that are associated with menopause, such as hot flashes and night sweats. Hormone replacement carries certain risks, especially as you become older. If you are thinking about using estrogen or estrogen with progestin treatments, discuss the benefits and risks with your health care provider. What should I know about heart disease and stroke? Heart disease, heart attack, and stroke become more likely as you age. This may be due, in part, to the hormonal changes that your body experiences during menopause. These can affect how your body processes dietary fats, triglycerides, and cholesterol. Heart attack and stroke are both medical emergencies. There are many things that you can do to help prevent heart disease  and stroke:  Have your blood pressure checked at least every 1-2 years. High blood pressure causes heart disease and increases the risk of stroke.  If you are 53-22 years old, ask your health care provider if you should take aspirin to prevent a heart attack or a stroke.  Do not use any tobacco products, including cigarettes, chewing tobacco, or electronic cigarettes. If you need help quitting, ask your health care provider.  It is important to eat a healthy diet and maintain a healthy weight. ? Be sure to include plenty of vegetables, fruits, low-fat dairy products, and lean protein. ? Avoid eating foods that are high in solid fats, added sugars, or salt (sodium).  Get regular exercise. This is one of the most important things that you can do for your health. ? Try to exercise for at least 150 minutes each week. The type of exercise that you do should increase your heart rate and make you sweat. This is known as moderate-intensity exercise. ? Try to do strengthening exercises at least twice each week. Do these in addition to the moderate-intensity exercise.  Know your numbers.Ask your health care provider to check your cholesterol and your blood glucose. Continue to have your blood tested as directed by your health care provider.  What should I know about cancer screening? There are several types of cancer. Take the following steps to reduce your risk and to catch any cancer development as early as possible. Breast Cancer  Practice breast self-awareness. ? This means understanding how your breasts normally appear and feel. ? It also means doing regular breast self-exams. Let your health care provider know about any changes, no matter how small.  If you are 40  or older, have a clinician do a breast exam (clinical breast exam or CBE) every year. Depending on your age, family history, and medical history, it may be recommended that you also have a yearly breast X-ray (mammogram).  If you  have a family history of breast cancer, talk with your health care provider about genetic screening.  If you are at high risk for breast cancer, talk with your health care provider about having an MRI and a mammogram every year.  Breast cancer (BRCA) gene test is recommended for women who have family members with BRCA-related cancers. Results of the assessment will determine the need for genetic counseling and BRCA1 and for BRCA2 testing. BRCA-related cancers include these types: ? Breast. This occurs in males or females. ? Ovarian. ? Tubal. This may also be called fallopian tube cancer. ? Cancer of the abdominal or pelvic lining (peritoneal cancer). ? Prostate. ? Pancreatic.  Cervical, Uterine, and Ovarian Cancer Your health care provider may recommend that you be screened regularly for cancer of the pelvic organs. These include your ovaries, uterus, and vagina. This screening involves a pelvic exam, which includes checking for microscopic changes to the surface of your cervix (Pap test).  For women ages 21-65, health care providers may recommend a pelvic exam and a Pap test every three years. For women ages 79-65, they may recommend the Pap test and pelvic exam, combined with testing for human papilloma virus (HPV), every five years. Some types of HPV increase your risk of cervical cancer. Testing for HPV may also be done on women of any age who have unclear Pap test results.  Other health care providers may not recommend any screening for nonpregnant women who are considered low risk for pelvic cancer and have no symptoms. Ask your health care provider if a screening pelvic exam is right for you.  If you have had past treatment for cervical cancer or a condition that could lead to cancer, you need Pap tests and screening for cancer for at least 20 years after your treatment. If Pap tests have been discontinued for you, your risk factors (such as having a new sexual partner) need to be  reassessed to determine if you should start having screenings again. Some women have medical problems that increase the chance of getting cervical cancer. In these cases, your health care provider may recommend that you have screening and Pap tests more often.  If you have a family history of uterine cancer or ovarian cancer, talk with your health care provider about genetic screening.  If you have vaginal bleeding after reaching menopause, tell your health care provider.  There are currently no reliable tests available to screen for ovarian cancer.  Lung Cancer Lung cancer screening is recommended for adults 69-62 years old who are at high risk for lung cancer because of a history of smoking. A yearly low-dose CT scan of the lungs is recommended if you:  Currently smoke.  Have a history of at least 30 pack-years of smoking and you currently smoke or have quit within the past 15 years. A pack-year is smoking an average of one pack of cigarettes per day for one year.  Yearly screening should:  Continue until it has been 15 years since you quit.  Stop if you develop a health problem that would prevent you from having lung cancer treatment.  Colorectal Cancer  This type of cancer can be detected and can often be prevented.  Routine colorectal cancer screening usually begins at  age 42 and continues through age 45.  If you have risk factors for colon cancer, your health care provider may recommend that you be screened at an earlier age.  If you have a family history of colorectal cancer, talk with your health care provider about genetic screening.  Your health care provider may also recommend using home test kits to check for hidden blood in your stool.  A small camera at the end of a tube can be used to examine your colon directly (sigmoidoscopy or colonoscopy). This is done to check for the earliest forms of colorectal cancer.  Direct examination of the colon should be repeated every  5-10 years until age 71. However, if early forms of precancerous polyps or small growths are found or if you have a family history or genetic risk for colorectal cancer, you may need to be screened more often.  Skin Cancer  Check your skin from head to toe regularly.  Monitor any moles. Be sure to tell your health care provider: ? About any new moles or changes in moles, especially if there is a change in a mole's shape or color. ? If you have a mole that is larger than the size of a pencil eraser.  If any of your family members has a history of skin cancer, especially at a young age, talk with your health care provider about genetic screening.  Always use sunscreen. Apply sunscreen liberally and repeatedly throughout the day.  Whenever you are outside, protect yourself by wearing long sleeves, pants, a wide-brimmed hat, and sunglasses.  What should I know about osteoporosis? Osteoporosis is a condition in which bone destruction happens more quickly than new bone creation. After menopause, you may be at an increased risk for osteoporosis. To help prevent osteoporosis or the bone fractures that can happen because of osteoporosis, the following is recommended:  If you are 46-71 years old, get at least 1,000 mg of calcium and at least 600 mg of vitamin D per day.  If you are older than age 55 but younger than age 65, get at least 1,200 mg of calcium and at least 600 mg of vitamin D per day.  If you are older than age 54, get at least 1,200 mg of calcium and at least 800 mg of vitamin D per day.  Smoking and excessive alcohol intake increase the risk of osteoporosis. Eat foods that are rich in calcium and vitamin D, and do weight-bearing exercises several times each week as directed by your health care provider. What should I know about how menopause affects my mental health? Depression may occur at any age, but it is more common as you become older. Common symptoms of depression  include:  Low or sad mood.  Changes in sleep patterns.  Changes in appetite or eating patterns.  Feeling an overall lack of motivation or enjoyment of activities that you previously enjoyed.  Frequent crying spells.  Talk with your health care provider if you think that you are experiencing depression. What should I know about immunizations? It is important that you get and maintain your immunizations. These include:  Tetanus, diphtheria, and pertussis (Tdap) booster vaccine.  Influenza every year before the flu season begins.  Pneumonia vaccine.  Shingles vaccine.  Your health care provider may also recommend other immunizations. This information is not intended to replace advice given to you by your health care provider. Make sure you discuss any questions you have with your health care provider. Document Released: 03/26/2005  Document Revised: 08/22/2015 Document Reviewed: 11/05/2014 Elsevier Interactive Patient Education  2018 [employer] Inc[employer]  

## 2017-01-01 LAB — TSH: TSH: 3.02 mIU/L (ref 0.40–4.50)

## 2017-01-01 LAB — COMPREHENSIVE METABOLIC PANEL
AG RATIO: 1.8 (calc) (ref 1.0–2.5)
ALKALINE PHOSPHATASE (APISO): 59 U/L (ref 33–130)
ALT: 15 U/L (ref 6–29)
AST: 20 U/L (ref 10–35)
Albumin: 4.3 g/dL (ref 3.6–5.1)
BUN: 15 mg/dL (ref 7–25)
CALCIUM: 9.3 mg/dL (ref 8.6–10.4)
CHLORIDE: 106 mmol/L (ref 98–110)
CO2: 23 mmol/L (ref 20–32)
Creat: 0.7 mg/dL (ref 0.50–0.99)
GLOBULIN: 2.4 g/dL (ref 1.9–3.7)
Glucose, Bld: 91 mg/dL (ref 65–99)
Potassium: 3.9 mmol/L (ref 3.5–5.3)
Sodium: 139 mmol/L (ref 135–146)
Total Bilirubin: 0.4 mg/dL (ref 0.2–1.2)
Total Protein: 6.7 g/dL (ref 6.1–8.1)

## 2017-01-01 LAB — CBC WITH DIFFERENTIAL/PLATELET
BASOS PCT: 0.4 %
Basophils Absolute: 33 cells/uL (ref 0–200)
EOS PCT: 4.1 %
Eosinophils Absolute: 340 cells/uL (ref 15–500)
HEMATOCRIT: 36.9 % (ref 35.0–45.0)
Hemoglobin: 12.8 g/dL (ref 11.7–15.5)
LYMPHS ABS: 2689 {cells}/uL (ref 850–3900)
MCH: 33.2 pg — ABNORMAL HIGH (ref 27.0–33.0)
MCHC: 34.7 g/dL (ref 32.0–36.0)
MCV: 95.6 fL (ref 80.0–100.0)
MPV: 11.4 fL (ref 7.5–12.5)
Monocytes Relative: 7.2 %
NEUTROS PCT: 55.9 %
Neutro Abs: 4640 cells/uL (ref 1500–7800)
Platelets: 276 10*3/uL (ref 140–400)
RBC: 3.86 10*6/uL (ref 3.80–5.10)
RDW: 11.7 % (ref 11.0–15.0)
Total Lymphocyte: 32.4 %
WBC: 8.3 10*3/uL (ref 3.8–10.8)
WBCMIX: 598 {cells}/uL (ref 200–950)

## 2017-01-01 LAB — LIPID PANEL
CHOLESTEROL: 185 mg/dL (ref <200)
HDL: 104 mg/dL (ref >50)
LDL Cholesterol (Calc): 60 mg/dL (calc)
Non-HDL Cholesterol (Calc): 81 mg/dL (calc) (ref <130)
Total CHOL/HDL Ratio: 1.8 (calc) (ref <5.0)
Triglycerides: 124 mg/dL (ref <150)

## 2017-01-01 LAB — VITAMIN D 25 HYDROXY (VIT D DEFICIENCY, FRACTURES): VIT D 25 HYDROXY: 34 ng/mL (ref 30–100)

## 2017-01-01 LAB — T4, FREE: Free T4: 1.3 ng/dL (ref 0.8–1.8)

## 2017-01-02 ENCOUNTER — Encounter: Payer: Self-pay | Admitting: Internal Medicine

## 2017-01-02 NOTE — Assessment & Plan Note (Signed)
Discussed getting an updated BMD, she reports her GYN orders this She will continue Prolia, Calcium and Vit D She will get 30 minutes of weight bearing exercise daily.

## 2017-01-02 NOTE — Assessment & Plan Note (Signed)
TSH and Free T4 today Will adjust and refill Synthroid as needed based on labs 

## 2017-01-10 NOTE — Progress Notes (Signed)
Tawana ScaleZach Cherish Runde D.O. [facility] [address] [place], [place] [address] Phone: [phone] Subjective:     CC: left wrist follow up   ION:[id number]:Subjective  Amber Soto is a 61 y.o. female coming in with complaint of left wrist pain.  Found to have a left distal radius fracture.  Has been following up.  Find some callus formation on ultrasound.  Patient states overall she thinks she is improving.  Still has some dull, throbbing aching sensation but nothing severe.       Past Medical History:  Diagnosis Date  . ALLERGIC RHINITIS 01/30/2007  . Allergy   . ANEMIA-NOS 01/30/2007  . CAROTID BRUIT, RIGHT 01/30/2007  . DIVERTICULUM, BLADDER 01/30/2007  . HEART MURMUR, HX OF 01/30/2007  . Unspecified hypothyroidism 02/25/2009  . UTI'S, HX OF 01/30/2007   Past Surgical History:  Procedure Laterality Date  . COLONOSCOPY     10 yrs ago- normal  . none     Social History   Socioeconomic History  . Marital status: Married    Spouse name: Not on file  . Number of children: 2  . Years of education: 5916  . Highest education level: Not on file  Social Needs  . Financial resource strain: Not on file  . Food insecurity - worry: Not on file  . Food insecurity - inability: Not on file  . Transportation needs - medical: Not on file  . Transportation needs - non-medical: Not on file  Occupational History  . 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 Smoking status: Never Smoker  . Smokeless tobacco: Never Used  Substance and Sexual Activity  . Alcohol use: Yes    Alcohol/week: 3.0 oz    Types: 6 Standard drinks or equivalent per week    Comment: occasional  . Drug use: No  . Sexual activity: Yes    Partners: Male  Other Topics Concern  . 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 Married 1979. 1 daughter 1985-married RN; 1 son [id number] [place]-graduated, works [place]-married '14. 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 Marriage in good health.  SO in good health   Allergies  Allergen Reactions  . Macrobid [Nitrofurantoin Monohyd Macro] Hives   Family History  Problem Relation Age of Onset  . Hypertension Mother   . Thyroid disease Mother        hypothyroid  . Alcohol abuse Mother   . Stroke Mother   . Alcohol abuse Father   . Arthritis Brother   . Depression Brother   . Colon polyps Brother   . Diabetes Other   . Cancer Maternal Aunt        bone cancer  . Arthritis Maternal Aunt        RA  . Colon cancer Neg Hx      Past medical history, social, surgical and family history all reviewed in electronic medical record.  No pertanent information unless stated regarding to the chief complaint.   Review of Systems:Review of systems updated and as accurate as of 01/10/17  No headache, visual changes, nausea, vomiting, diarrhea, constipation, dizziness, abdominal pain, skin rash, fevers, chills, night sweats, weight loss, swollen lymph nodes, body aches, joint swelling, muscle aches, chest pain, shortness of breath, mood changes.   Objective  There were no vitals taken for this visit. Systems examined below as of 01/10/17   General: No apparent distress alert and oriented x3 mood and affect normal, dressed appropriately.  HEENT: Pupils  equal, extraocular movements intact  Respiratory: Patient's speak in full sentences and does not appear short of breath  Cardiovascular: No lower extremity edema, non tender, no erythema  Skin: Warm dry intact with no signs of infection or rash on extremities or on axial skeleton.  Abdomen: Soft nontender  Neuro: Cranial nerves II through XII are intact, neurovascularly intact in all extremities with 2+ DTRs and 2+ pulses.  Lymph: No lymphadenopathy of posterior or anterior cervical chain or axillae bilaterally.  Gait normal with good balance and coordination.  MSK:  Non tender with full range of motion and good stability and symmetric strength and tone of shoulders, elbows, hip, knee and  ankles bilaterally.  Wrist: Left Inspection normal with no visible erythema or swelling.  Mild arthritic changes ROM smooth and normal with good flexion and extension and ulnar/radial deviation that is symmetrical with opposite wrist. Palpation is normal over metacarpals, navicular, lunate, and TFCC; tendons without tenderness/ swelling mild pain over the distal radius still noted. No snuffbox tenderness. No tenderness over Canal of Guyon. Strength 5/5 in all directions without pain. Negative Finkelstein, tinel's and phalens. Negative Watson's test. Contralateral wrist unremarkable  MSK US performed of: Left wrist This study was ordered, performed, and interpreted by Terrilee FilesZach Jasmene Goswami D.O.  Wrist: Patient does have near full callus formation over the distal radius where the fracture was seen previously.  Significant increase in neovascularization here as well as over the scaphoid bone but no true cortical defect noted of the scaphoid.  Mild increase in vascularity around the TFCC but no true tear appreciated.  IMPRESSION: Interval healing of distal radius fracture   Impression and Recommendations:     This case required medical decision making of moderate complexity.      Note: This dictation was prepared with Dragon dictation along with smaller phrase technology. Any transcriptional errors that result from this process are unintentional.

## 2017-01-11 ENCOUNTER — Ambulatory Visit: Payer: Self-pay

## 2017-01-11 ENCOUNTER — Ambulatory Visit: Payer: Managed Care, Other (non HMO) | Admitting: Family Medicine

## 2017-01-11 ENCOUNTER — Encounter: Payer: Self-pay | Admitting: Family Medicine

## 2017-01-11 VITALS — BP 140/78 | HR 85 | Ht 66.0 in | Wt 141.0 lb

## 2017-01-11 DIAGNOSIS — S52532D Colles' fracture of left radius, subsequent encounter for closed fracture with routine healing: Secondary | ICD-10-CM

## 2017-01-11 DIAGNOSIS — M25532 Pain in left wrist: Secondary | ICD-10-CM

## 2017-01-11 DIAGNOSIS — S52502A Unspecified fracture of the lower end of left radius, initial encounter for closed fracture: Secondary | ICD-10-CM | POA: Insufficient documentation

## 2017-01-11 NOTE — Assessment & Plan Note (Signed)
Patient is healing.  Callus formation is noted.  Continue the vitamin D, given home exercises to start in 1 week.  Discussed icing regimen, over-the-counter medications that can help with the underlying arthritic changes.  Follow-up again in 3-4 weeks

## 2017-01-11 NOTE — Patient Instructions (Signed)
Good to see you  Amber Soto is your friend.  Keep the brace day and night for the week then nightly for 2 weeks Continue the vitamin D Exercises 3 times a week.  Start Monday  Turmeric 500mg  daily  Tart cherry extract any dose at night  See me again in 3 weeks

## 2017-01-19 ENCOUNTER — Telehealth: Payer: Self-pay | Admitting: *Deleted

## 2017-01-19 NOTE — Telephone Encounter (Signed)
Information has been submitted to pts insurance for verification of benefits. Awaiting response for coverage  

## 2017-01-30 ENCOUNTER — Other Ambulatory Visit: Payer: Self-pay | Admitting: Internal Medicine

## 2017-02-01 NOTE — Progress Notes (Signed)
Tawana ScaleZach Neng Albee D.O. [facility] [address] [place], [place] [address] Phone: [phone] Subjective:     CC: Left wrist follow-up  HQI:[id number]:Subjective  Amber Soto is a 61 y.o. female coming in with complaint of left wrist follow-up.  Patient was found to have a distal radius fracture.  Patient was put in a soft removable splints.  Patient has been doing much better with this and vitamin D.  States that she is 80% better.  Increasing range of motion and doing the exercises fairly regularly.  Vitamin D supplementation with no side effects.      Past Medical History:  Diagnosis Date  . ALLERGIC RHINITIS 01/30/2007  . Allergy   . ANEMIA-NOS 01/30/2007  . CAROTID BRUIT, RIGHT 01/30/2007  . DIVERTICULUM, BLADDER 01/30/2007  . HEART MURMUR, HX OF 01/30/2007  . Unspecified hypothyroidism 02/25/2009  . UTI'S, HX OF 01/30/2007   Past Surgical History:  Procedure Laterality Date  . COLONOSCOPY     10 yrs ago- normal  . none     Social History   Socioeconomic History  . Marital status: Married    Spouse name: None  . Number of children: 2  . Years of education: 1816  . Highest education level: None  Social Needs  . Financial resource strain: None  . Food insecurity - worry: None  . Food insecurity - inability: None  . Transportation needs - medical: None  . Transportation needs - non-medical: None  Occupational History  . 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 Smoking status: Never Smoker  . Smokeless tobacco: Never Used  Substance and Sexual Activity  . Alcohol use: Yes    Alcohol/week: 3.0 oz    Types: 6 Standard drinks or equivalent per week    Comment: occasional  . Drug use: No  . Sexual activity: Yes    Partners: Male  Other Topics Concern  . 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 Married 1979. 1 daughter 1985-married RN; 1 son [id number] [place]-graduated, works [employer]-married '14. 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  Marriage in good health. SO in good health   Allergies  Allergen Reactions  . Macrobid [Nitrofurantoin Monohyd Macro] Hives   Family History  Problem Relation Age of Onset  . Hypertension Mother   . Thyroid disease Mother        hypothyroid  . Alcohol abuse Mother   . Stroke Mother   . Alcohol abuse Father   . Arthritis Brother   . Depression Brother   . Colon polyps Brother   . Diabetes Other   . Cancer Maternal Aunt        bone cancer  . Arthritis Maternal Aunt        RA  . Colon cancer Neg Hx      Past medical history, social, surgical and family history all reviewed in electronic medical record.  No pertanent information unless stated regarding to the chief complaint.   Review of Systems:Review of systems updated and as accurate as of 02/02/17  No headache, visual changes, nausea, vomiting, diarrhea, constipation, dizziness, abdominal pain, skin rash, fevers, chills, night sweats, weight loss, swollen lymph nodes, body aches, joint swelling, muscle aches, chest pain, shortness of breath, mood changes.   Objective  Blood pressure 110/84, pulse 78, height 5\' 7"  (1.702 m), weight 140 lb (63.5 kg), SpO2 96 %. Systems examined below as of 02/02/17   General: No apparent distress alert and oriented x3 mood and  affect normal, dressed appropriately.  HEENT: Pupils equal, extraocular movements intact  Respiratory: Patient's speak in full sentences and does not appear short of breath  Cardiovascular: No lower extremity edema, non tender, no erythema  Skin: Warm dry intact with no signs of infection or rash on extremities or on axial skeleton.  Abdomen: Soft nontender  Neuro: Cranial nerves II through XII are intact, neurovascularly intact in all extremities with 2+ DTRs and 2+ pulses.  Lymph: No lymphadenopathy of posterior or anterior cervical chain or axillae bilaterally.  Gait normal with good balance and coordination.  MSK:  Non tender with full range of motion and good  stability and symmetric strength and tone of shoulders, elbows,  hip, knee and ankles bilaterally.  Wrist: Left Inspection normal with no visible erythema or swelling. ROM smooth and normal with good flexion and extension and ulnar/radial deviation that is symmetrical with opposite wrist. Palpation is normal over metacarpals, navicular, lunate, and TFCC; tendons without tenderness/ swelling No snuffbox tenderness. Very mild discomfort over the distal radius still noted. Strength 5/5 in all directions without pain. Negative Finkelstein, tinel's and phalens. Negative Watson's test. Contralateral wrist unremarkable  MSK US performed of: Left wrist This study was ordered, performed, and interpreted by Terrilee FilesZach Abbe Bula D.O.  Wrist: Distal radius area fracture was noted previously has a good callus formation.  Seems to be nearly full with good starting to have callus that is hard.  IMPRESSION: Interval healing of distal radius fracture   Impression and Recommendations:     This case required medical decision making of moderate complexity.      Note: This dictation was prepared with Dragon dictation along with smaller phrase technology. Any transcriptional errors that result from this process are unintentional.

## 2017-02-02 ENCOUNTER — Ambulatory Visit: Payer: Self-pay

## 2017-02-02 ENCOUNTER — Encounter: Payer: Self-pay | Admitting: Family Medicine

## 2017-02-02 ENCOUNTER — Ambulatory Visit: Payer: Managed Care, Other (non HMO) | Admitting: Family Medicine

## 2017-02-02 VITALS — BP 110/84 | HR 78 | Ht 67.0 in | Wt 140.0 lb

## 2017-02-02 DIAGNOSIS — S52532D Colles' fracture of left radius, subsequent encounter for closed fracture with routine healing: Secondary | ICD-10-CM | POA: Diagnosis not present

## 2017-02-02 DIAGNOSIS — M25539 Pain in unspecified wrist: Secondary | ICD-10-CM

## 2017-02-02 NOTE — Patient Instructions (Signed)
Good to see you  Ice 20 minutes 2 times daily. Usually after activity and before bed. Try no brace during the day on Monday  Still at night for 2 weeks.  Continue the vitamin D Exercises 3 times a week.  See me again in 3 weeks

## 2017-02-02 NOTE — Assessment & Plan Note (Signed)
Improving at this time.  Encourage patient continue to once weekly vitamin D.  We will transition patient out of brace during the day.  Patient will continue to do nighttime bracing for 2 more weeks.  We discussed icing regimen, encouraged home exercises still 3 times a week.  Follow-up again in 3 weeks and likely will be released at that time.

## 2017-02-21 ENCOUNTER — Other Ambulatory Visit: Payer: Self-pay | Admitting: *Deleted

## 2017-02-21 MED ORDER — VITAMIN D (ERGOCALCIFEROL) 1.25 MG (50000 UNIT) PO CAPS: 50000.0000 [IU] | ORAL_CAPSULE | ORAL | 0 refills | Status: DC

## 2017-02-22 NOTE — Progress Notes (Signed)
[facility] [address] [place], [place] [address] Phone: [phone] Subjective:     CC: Left wrist pain  BJY:[id number]  Amber Soto is a 62 y.o. female coming in with complaint of left wrist pain.  Patient was seen previously and was found to have a distal radius fracture from a fall.  Was 80% better at follow-up.  Patient was making progress.  Patient states that she has been doing good but has had a few incidences where she had pain. She lifted a 12# box and that bothered her as well as yesterday she picked up an infant that caused some pain.     Past Medical History:  Diagnosis Date  . ALLERGIC RHINITIS 01/30/2007  . Allergy   . ANEMIA-NOS 01/30/2007  . CAROTID BRUIT, RIGHT 01/30/2007  . DIVERTICULUM, BLADDER 01/30/2007  . HEART MURMUR, HX OF 01/30/2007  . Unspecified hypothyroidism 02/25/2009  . UTI'S, HX OF 01/30/2007   Past Surgical History:  Procedure Laterality Date  . COLONOSCOPY     10 yrs ago- normal  . none     Social History   Socioeconomic History  . Marital status: Married    Spouse name: None  . Number of children: 2  . Years of education: 10  . Highest education level: None  Social Needs  . Financial resource strain: None  . Food insecurity - worry: None  . Food insecurity - inability: None  . Transportation needs - medical: None  . Transportation needs - non-medical: None  Occupational History  . Occupation: Fish farm manager  Tobacco Use  . Smoking status: Never Smoker  . Smokeless tobacco: Never Used  Substance and Sexual Activity  . Alcohol use: Yes    Alcohol/week: 3.0 oz    Types: 6 Standard drinks or equivalent per week    Comment: occasional  . Drug use: No  . Sexual activity: Yes    Partners: Male  Other Topics Concern  . 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 Married 1979. 1 daughter 1985-married RN; 1 son 42 [place]-graduated, works [place]-married '14. Work: Scientist, water quality at [employer]. Marriage in good health. SO in good health   Allergies  Allergen Reactions  . Macrobid [Nitrofurantoin Monohyd Macro] Hives   Family History  Problem Relation Age of Onset  . Hypertension Mother   . Thyroid disease Mother        hypothyroid  . Alcohol abuse Mother   . Stroke Mother   . Alcohol abuse Father   . Arthritis Brother   . Depression Brother   . Colon polyps Brother   . Diabetes Other   . Cancer Maternal Aunt        bone cancer  . Arthritis Maternal Aunt        RA  . Colon cancer Neg Hx      Past medical history, social, surgical and family history all reviewed in electronic medical record.  No pertanent information unless stated regarding to the chief complaint.   Review of Systems:Review of systems updated and as accurate as of 02/23/17  No headache, visual changes, nausea, vomiting, diarrhea, constipation, dizziness, abdominal pain, skin rash, fevers, chills, night sweats, weight loss, swollen lymph nodes, body aches, joint swelling, muscle aches, chest pain, shortness of breath, mood changes.   Objective  Blood pressure 110/78, pulse 80, height 5\' 7"  (1.702 m), weight 141 lb (64 kg), SpO2 98 %. Systems examined below as of 02/23/17  General: No apparent distress alert and oriented x3 mood and affect normal, dressed appropriately.  HEENT: Pupils equal, extraocular movements intact  Respiratory: Patient's speak in full sentences and does not appear short of breath  Cardiovascular: No lower extremity edema, non tender, no erythema  Skin: Warm dry intact with no signs of infection or rash on extremities or on axial skeleton.  Abdomen: Soft nontender  Neuro: Cranial nerves II through XII are intact, neurovascularly intact in all extremities with 2+ DTRs and 2+ pulses.  Lymph: No lymphadenopathy of posterior or anterior cervical chain or axillae bilaterally.  Gait normal with good balance and coordination.  MSK:  Non tender with full  range of motion and good stability and symmetric strength and tone of shoulders, elbows,  hip, knee and ankles bilaterally.  Wrist: left  Inspection shows the patient does have mild ROM smooth and normal with good flexion and extension and ulnar/radial deviation that is symmetrical with opposite wrist. Palpation is normal over metacarpals, navicular, lunate, and TFCC; tendons without tenderness/ swelling No snuffbox tenderness. No tenderness over Canal of Guyon. Strength 4+/5 in all directions without pain. Negative Finkelstein, tinel's and phalens. Negative Watson's test. Contralateral wrist unremarkable  MSK US performed of: left  This study was ordered, performed, and interpreted by Terrilee FilesZach Laruen Risser D.O.  Wrist: Patient has had reabsorption of the bone at this time.  No significant callus formation noted.  Mild increase in Doppler flow still noted in the area  IMPRESSION: Healed distal radius fracture   Impression and Recommendations:     This case required medical decision making of moderate complexity.      Note: This dictation was prepared with Dragon dictation along with smaller phrase technology. Any transcriptional errors that result from this process are unintentional.

## 2017-02-23 ENCOUNTER — Ambulatory Visit: Payer: Managed Care, Other (non HMO) | Admitting: Family Medicine

## 2017-02-23 ENCOUNTER — Ambulatory Visit: Payer: Self-pay

## 2017-02-23 ENCOUNTER — Encounter: Payer: Self-pay | Admitting: Family Medicine

## 2017-02-23 VITALS — BP 110/78 | HR 80 | Ht 67.0 in | Wt 141.0 lb

## 2017-02-23 DIAGNOSIS — S52532D Colles' fracture of left radius, subsequent encounter for closed fracture with routine healing: Secondary | ICD-10-CM

## 2017-02-23 DIAGNOSIS — M25532 Pain in left wrist: Secondary | ICD-10-CM

## 2017-02-23 MED ORDER — DICLOFENAC SODIUM 2 % TD SOLN: 2.0000 g | Freq: Two times a day (BID) | TRANSDERMAL | 3 refills | Status: DC

## 2017-02-23 NOTE — Patient Instructions (Addendum)
Great to see you  I am glad you are doing better Should be good to go  Increase activity as tolerated.  pennsaid pinkie amount topically 2 times daily as needed.  See me when you need me!

## 2017-02-23 NOTE — Assessment & Plan Note (Signed)
I believe the patient is well healed at this time.  Encourage her to continue to once weekly vitamin D secondary to osteoporosis.  Can increase activity with no restrictions.  Patient can follow-up as needed

## 2017-05-02 ENCOUNTER — Telehealth: Payer: Self-pay

## 2017-05-02 NOTE — Telephone Encounter (Signed)
Patient needs an order for Calcium to be drawn through her work, needs a hard copy to pick up to take with her please. She gets this with no charge through work. Let patient know when ready, patient will stop by Wednesday and pick this up if it is ready. CB [phone]. [facility] employee clinic number if needed [phone])-Evalisse Prajapati V Georgeana Oertel, RMA  This is all due to getting Prolia injections. Patient is aware Nicki ReaperRegina Baity is not in the office today.

## 2017-05-03 NOTE — Telephone Encounter (Signed)
Can you call pt? She had calcium done in 12/2016. How often does she need this done. Does she want me to add a Vit D as well?

## 2017-05-04 ENCOUNTER — Telehealth: Payer: Self-pay

## 2017-05-04 NOTE — Telephone Encounter (Signed)
Spoke to PrescottRBaity and advised; hard copy placed at front desk for pt to pickup

## 2017-05-04 NOTE — Telephone Encounter (Signed)
Copied from CRM [phone]. Topic: General - Call Back - No Documentation >> May 04, 2017  8:43 AM Landry MellowFoltz, Melissa J wrote: Reason for CRM: pt is returning call - she thinks that she needs to have labs - please [phone]

## 2017-05-04 NOTE — Telephone Encounter (Signed)
Left message on voicemail.

## 2017-05-04 NOTE — Telephone Encounter (Signed)
Pt states she knew that the calcium level is usually needed before the Prolia inj, but this has not been approved or scheduled... [facility] it is time to get labs drawn... Will forward to [facility] to get an update

## 2017-05-04 NOTE — Telephone Encounter (Signed)
She was on prolia at another office and is going to begin getting them here. She is just wanting a hard copy so, once I get her benefits back, she can already have to order to have drawn at an outside lab.

## 2017-05-05 ENCOUNTER — Telehealth: Payer: Self-pay | Admitting: *Deleted

## 2017-05-05 NOTE — Telephone Encounter (Signed)
Information has been submitted to pts insurance for verification of benefits. Awaiting response for coverage  

## 2017-05-11 NOTE — Telephone Encounter (Signed)
Verification of benefits have been processed and an approval has been received for pts prolia injection. Pts estimated cost are appx $30. This is only an estimate and cannot be confirmed until benefits are paid. Please advise pt and schedule if needed. If scheduled, once the injection is received, pls contact me back with the date it was received so that I am able to update prolia folder. thanks   Per prolia rep, a PA is required. I spoke to Rivka SaferSeth A. [employer] on 05/10/2017 who states a PA is NOT required. I advised prolia rep of his advise and she states she will look into it further and contact me back with a resolution. She is not wanting pt to incur charges for receiving the injection.   Spoke to pt and advised

## 2017-05-12 NOTE — Telephone Encounter (Signed)
Spoke to [employer] rep who suggested calling [employer] again. I spoke with Celesta AverKat S 3/28 @ 1024 who also confirms that a PA is not required. I then, contacted prolia rep back and advised; states we may proceed with injection.    Spoke to pt and advised. She is agreeable to charge and will have labs drawn at her employers health clinic. Orders faxed at her request and she will have them send to us when resulted. Once received, pt will be contacted to schedule prolia inj.

## 2017-05-13 ENCOUNTER — Other Ambulatory Visit: Payer: Self-pay

## 2017-05-13 DIAGNOSIS — E58 Dietary calcium deficiency: Secondary | ICD-10-CM

## 2017-05-14 LAB — CALCIUM: Calcium: 10.1 mg/dL (ref 8.7–10.3)

## 2017-05-26 NOTE — Telephone Encounter (Signed)
Spoke to pt and advised results received; prolia injection scheduled

## 2017-06-02 ENCOUNTER — Ambulatory Visit (INDEPENDENT_AMBULATORY_CARE_PROVIDER_SITE_OTHER): Payer: Managed Care, Other (non HMO) | Admitting: *Deleted

## 2017-06-02 DIAGNOSIS — M818 Other osteoporosis without current pathological fracture: Secondary | ICD-10-CM | POA: Diagnosis not present

## 2017-06-02 MED ORDER — DENOSUMAB 60 MG/ML ~~LOC~~ SOSY
60.0000 mg | PREFILLED_SYRINGE | Freq: Once | SUBCUTANEOUS | Status: AC
  Administered 2017-06-02: 60 mg via SUBCUTANEOUS

## 2017-10-21 ENCOUNTER — Other Ambulatory Visit: Payer: Self-pay | Admitting: Internal Medicine

## 2017-10-24 ENCOUNTER — Telehealth: Payer: Self-pay | Admitting: *Deleted

## 2017-10-24 NOTE — Telephone Encounter (Signed)
CPE reminder letter mailed 

## 2017-10-24 NOTE — Telephone Encounter (Signed)
Information has been submitted to pts insurance for verification of benefits. Awaiting response for coverage  

## 2017-11-11 ENCOUNTER — Telehealth: Payer: Self-pay

## 2017-11-11 NOTE — Telephone Encounter (Signed)
Copied from CRM [id number]. Topic: General - Other >> Nov 11, 2017  8:22 AM Tamela Oddi wrote: Reason for CRM: Patient called to schedule an annual physical appointment and would like a referral for labs at another location [facility] so that she will note be charged.  Please advise and call patient to confirm.  CB# [phone]

## 2017-11-11 NOTE — Telephone Encounter (Signed)
I will give a RX at appt to be taken somewhere else to get labs done.

## 2017-11-15 ENCOUNTER — Other Ambulatory Visit: Payer: Self-pay | Admitting: Internal Medicine

## 2017-11-15 DIAGNOSIS — M810 Age-related osteoporosis without current pathological fracture: Secondary | ICD-10-CM

## 2017-11-16 NOTE — Telephone Encounter (Signed)
Pt has a nurse visit almost 2 weeks before her CPE, can she pick up orders then?

## 2017-11-16 NOTE — Telephone Encounter (Signed)
RX placed in MYD box 

## 2017-11-21 NOTE — Telephone Encounter (Signed)
Left message on voicemail Order placed in front office for pick up

## 2017-12-07 ENCOUNTER — Other Ambulatory Visit: Payer: Self-pay

## 2017-12-07 DIAGNOSIS — M810 Age-related osteoporosis without current pathological fracture: Secondary | ICD-10-CM

## 2017-12-08 LAB — CALCIUM: Calcium: 9.8 mg/dL (ref 8.7–10.3)

## 2017-12-08 NOTE — Progress Notes (Addendum)
Lab results from 12/07/17 have been routed to ordering provider  Nicki Reaper, NP  12/08/17 Erskine Squibb Geanna Divirgilio CMA

## 2017-12-09 NOTE — Addendum Note (Signed)
Addended by: Tambria Pfannenstiel on: 12/09/2017 02:11 PM   Modules accepted: Level of Service  

## 2017-12-15 ENCOUNTER — Ambulatory Visit (INDEPENDENT_AMBULATORY_CARE_PROVIDER_SITE_OTHER): Payer: Managed Care, Other (non HMO)

## 2017-12-15 ENCOUNTER — Other Ambulatory Visit: Payer: Self-pay

## 2017-12-15 DIAGNOSIS — M810 Age-related osteoporosis without current pathological fracture: Secondary | ICD-10-CM

## 2017-12-15 DIAGNOSIS — Z23 Encounter for immunization: Secondary | ICD-10-CM | POA: Diagnosis not present

## 2017-12-15 MED ORDER — DENOSUMAB 60 MG/ML ~~LOC~~ SOSY
60.0000 mg | PREFILLED_SYRINGE | Freq: Once | SUBCUTANEOUS | Status: AC
  Administered 2017-12-15: 60 mg via SUBCUTANEOUS

## 2017-12-15 NOTE — Telephone Encounter (Signed)
Lab orders faxed to [facility] employment faxed to [phone]

## 2017-12-16 LAB — CBC WITH DIFFERENTIAL/PLATELET
Basophils Absolute: 0 10*3/uL (ref 0.0–0.2)
Basos: 0 %
EOS (ABSOLUTE): 0.2 10*3/uL (ref 0.0–0.4)
Eos: 3 %
Hematocrit: 43.6 % (ref 34.0–46.6)
Hemoglobin: 14.4 g/dL (ref 11.1–15.9)
IMMATURE GRANS (ABS): 0 10*3/uL (ref 0.0–0.1)
IMMATURE GRANULOCYTES: 0 %
LYMPHS: 30 %
Lymphocytes Absolute: 1.7 10*3/uL (ref 0.7–3.1)
MCH: 31.8 pg (ref 26.6–33.0)
MCHC: 33 g/dL (ref 31.5–35.7)
MCV: 96 fL (ref 79–97)
Monocytes Absolute: 0.3 10*3/uL (ref 0.1–0.9)
Monocytes: 6 %
NEUTROS PCT: 61 %
Neutrophils Absolute: 3.4 10*3/uL (ref 1.4–7.0)
PLATELETS: 255 10*3/uL (ref 150–450)
RBC: 4.53 x10E6/uL (ref 3.77–5.28)
RDW: 11.9 % — ABNORMAL LOW (ref 12.3–15.4)
WBC: 5.7 10*3/uL (ref 3.4–10.8)

## 2017-12-16 LAB — COMPREHENSIVE METABOLIC PANEL
A/G RATIO: 2.6 — AB (ref 1.2–2.2)
ALT: 19 IU/L (ref 0–32)
AST: 19 IU/L (ref 0–40)
Albumin: 4.5 g/dL (ref 3.6–4.8)
Alkaline Phosphatase: 55 IU/L (ref 39–117)
BILIRUBIN TOTAL: 0.5 mg/dL (ref 0.0–1.2)
BUN/Creatinine Ratio: 14 (ref 12–28)
BUN: 10 mg/dL (ref 8–27)
CALCIUM: 9.5 mg/dL (ref 8.7–10.3)
CHLORIDE: 104 mmol/L (ref 96–106)
CO2: 23 mmol/L (ref 20–29)
Creatinine, Ser: 0.71 mg/dL (ref 0.57–1.00)
GFR calc Af Amer: 105 mL/min/{1.73_m2} (ref >59)
GFR, EST NON AFRICAN AMERICAN: 91 mL/min/{1.73_m2} (ref >59)
Globulin, Total: 1.7 g/dL (ref 1.5–4.5)
Glucose: 90 mg/dL (ref 65–99)
Potassium: 4.3 mmol/L (ref 3.5–5.2)
SODIUM: 143 mmol/L (ref 134–144)
Total Protein: 6.2 g/dL (ref 6.0–8.5)

## 2017-12-16 LAB — LIPID PANEL WITH LDL/HDL RATIO
Cholesterol, Total: 201 mg/dL — ABNORMAL HIGH (ref 100–199)
HDL: 104 mg/dL (ref >39)
LDL Calculated: 83 mg/dL (ref 0–99)
LDL/HDL RATIO: 0.8 ratio (ref 0.0–3.2)
TRIGLYCERIDES: 71 mg/dL (ref 0–149)
VLDL Cholesterol Cal: 14 mg/dL (ref 5–40)

## 2017-12-16 LAB — TSH+FREE T4
FREE T4: 1.59 ng/dL (ref 0.82–1.77)
TSH: 2.09 u[IU]/mL (ref 0.450–4.500)

## 2017-12-16 LAB — VITAMIN D 25 HYDROXY (VIT D DEFICIENCY, FRACTURES): VIT D 25 HYDROXY: 24.7 ng/mL — AB (ref 30.0–100.0)

## 2017-12-16 NOTE — Progress Notes (Signed)
Lab results from 12/15/17 have been routed to the ordering provider Nicki Reaper NP 12/16/17

## 2017-12-22 ENCOUNTER — Telehealth: Payer: Self-pay

## 2017-12-22 NOTE — Telephone Encounter (Signed)
Left message on voice mail with lab results.

## 2017-12-22 NOTE — Progress Notes (Signed)
Pt due for Prolia injection. Last bone density, calcium and vit d level reviewed Nicki Reaper, NP

## 2018-01-05 ENCOUNTER — Ambulatory Visit (INDEPENDENT_AMBULATORY_CARE_PROVIDER_SITE_OTHER): Payer: Managed Care, Other (non HMO) | Admitting: Internal Medicine

## 2018-01-05 ENCOUNTER — Encounter: Payer: Self-pay | Admitting: Internal Medicine

## 2018-01-05 VITALS — BP 112/78 | HR 83 | Temp 98.8°F | Ht 66.5 in | Wt 143.0 lb

## 2018-01-05 DIAGNOSIS — E039 Hypothyroidism, unspecified: Secondary | ICD-10-CM | POA: Diagnosis not present

## 2018-01-05 DIAGNOSIS — M810 Age-related osteoporosis without current pathological fracture: Secondary | ICD-10-CM

## 2018-01-05 DIAGNOSIS — M19039 Primary osteoarthritis, unspecified wrist: Secondary | ICD-10-CM | POA: Diagnosis not present

## 2018-01-05 DIAGNOSIS — Z0000 Encounter for general adult medical examination without abnormal findings: Secondary | ICD-10-CM | POA: Diagnosis not present

## 2018-01-05 NOTE — Assessment & Plan Note (Signed)
TSH and Free T4 normal Continue current dose of Levothryoxine

## 2018-01-05 NOTE — Progress Notes (Signed)
Subjective:    Patient ID: [name], female    DOB: 1955-03-28, 62 y.o.   MRN: [id number]  HPI  Pt presents to the clinic today for her annual exam. She is also due to follow up chronic conditions.  Hypothyroidism: Her thyroid levels were last check 11/2017. She denies any issues on her current dose of Levothyroxine.  OA: Mainly of her right wrist. She uses Diclfofenac gel with some relief.  Osteoporosis: Managed on Prolia injections. She is getting weight bearing exercise daily. Bone density from 11/2011 reviewed.  Flu: 11/2017 Tetanus: < 10 years ago Zostovax: 11/2015 Pap Smear: 12/2016, [facility] Mammogram: 12/2016, scheduled 01/16/2018 Colon Screening: 10/2015 Bone Density: 11/2013 Vision Screening: annually Dentist: biannually  Diet: Exercise:  Review of Systems      Past Medical History:  Diagnosis Date  . ALLERGIC RHINITIS 01/30/2007  . Allergy   . ANEMIA-NOS 01/30/2007  . CAROTID BRUIT, RIGHT 01/30/2007  . DIVERTICULUM, BLADDER 01/30/2007  . HEART MURMUR, HX OF 01/30/2007  . Unspecified hypothyroidism 02/25/2009  . UTI'S, HX OF 01/30/2007    Current Outpatient Medications  Medication Sig Dispense Refill  . Cholecalciferol (VITAMIN D3) 2000 UNITS TABS Take 1 tablet by mouth daily.    . ciprofloxacin (CIPRO) 500 MG tablet Take 500 mg by mouth as needed (after sex).    Marland Kitchen denosumab (PROLIA) 60 MG/ML SOLN injection Inject 60 mg into the skin every 6 (six) months. Administer in upper arm, thigh, or abdomen    . Diclofenac Sodium 2 % SOLN Place 2 g onto the skin 2 (two) times daily. 112 g 3  . estradiol (VAGIFEM) 25 MCG vaginal tablet Place 25 mcg vaginally 2 (two) times a week.    . levothyroxine (SYNTHROID, LEVOTHROID) 75 MCG tablet Take 1 tablet (75 mcg total) by mouth daily before breakfast. MUST SCHEDULE ANNUAL EXAM 90 tablet 0  . loratadine (CLARITIN) 10 MG tablet Take 10 mg by mouth as needed.      . naproxen sodium (ANAPROX) 220 MG tablet Take  220 mg by mouth 2 (two) times daily with a meal.    . pseudoephedrine (SUDAFED) 30 MG tablet Take 30 mg by mouth as needed.      . Vitamin D, Ergocalciferol, (DRISDOL) 50000 units CAPS capsule Take 1 capsule (50,000 Units total) by mouth every 7 (seven) days. Take for 8 total doses(weeks) 8 capsule 0   Current Facility-Administered Medications  Medication Dose Route Frequency Provider Last Rate Last Dose  . 0.9 %  sodium chloride infusion  500 mL Intravenous Continuous Nandigam, Eleonore Chiquito, MD        Allergies  Allergen Reactions  . Macrobid [Nitrofurantoin Monohyd Macro] Hives    Family History  Problem Relation Age of Onset  . Hypertension Mother   . Thyroid disease Mother        hypothyroid  . Alcohol abuse Mother   . Stroke Mother   . Alcohol abuse Father   . Arthritis Brother   . Depression Brother   . Colon polyps Brother   . Diabetes Other   . Cancer Maternal Aunt        bone cancer  . Arthritis Maternal Aunt        RA  . Colon cancer Neg Hx     Social History   Socioeconomic History  . Marital status: Married    Spouse name: Not on file  . Number of children: 2  . Years of education: 16  . Highest education level:  Not on file  Occupational History  . 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 Financial resource strain: Not on file  . Food insecurity:    Worry: Not on file    Inability: Not on file  . Transportation needs:    Medical: Not on file    Non-medical: Not on file  Tobacco Use  . Smoking status: Never Smoker  . Smokeless tobacco: Never Used  Substance and Sexual Activity  . Alcohol use: Yes    Alcohol/week: 6.0 standard drinks    Types: 6 Standard drinks or equivalent per week    Comment: occasional  . Drug use: No  . Sexual activity: Yes    Partners: Male  Lifestyle  . Physical activity:    Days per week: Not on file    Minutes per session: Not on file  . Stress: Not on file  Relationships  . Social connections:    Talks on  phone: Not on file    Gets together: Not on file    Attends religious service: Not on file    Active member of club or organization: Not on file    Attends meetings of clubs or organizations: Not on file    Relationship status: Not on file  . Intimate partner violence:    Fear of current or ex partner: Not on file    Emotionally abused: Not on file    Physically abused: Not on file    Forced sexual activity: Not on file  Other Topics Concern  . Not on file  Social History Narrative   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 Married 1979. 1 daughter 1985-married RN; 1 son [id number] [place]-graduated, works [employer]-married '14. 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 Marriage in good health. SO in good health     Constitutional: Denies fever, malaise, fatigue, headache or abrupt weight changes.  HEENT: Denies eye pain, eye redness, ear pain, ringing in the ears, wax buildup, runny nose, nasal congestion, bloody nose, or sore throat. Respiratory: Denies difficulty breathing, shortness of breath, cough or sputum production.   Cardiovascular: Denies chest pain, chest tightness, palpitations or swelling in the hands or feet.  Gastrointestinal: Denies abdominal pain, bloating, constipation, diarrhea or blood in the stool.  GU: Denies urgency, frequency, pain with urination, burning sensation, blood in urine, odor or discharge. Musculoskeletal: Pt reports intermittent right wrist pain. Denies decrease in range of motion, difficulty with gait, muscle pain or joint swelling.  Skin: Denies redness, rashes, lesions or ulcercations.  Neurological: Denies dizziness, difficulty with memory, difficulty with speech or problems with balance and coordination.  Psych: Denies anxiety, depression, SI/HI.  No other specific complaints in a complete review of systems (except as listed in HPI above).  Objective:   Physical Exam   BP 112/78   Pulse 83   Temp 98.8 F (37.1 C) (Oral)   Ht 5' 6.5" (1.689 m)   Wt 143 lb  (64.9 kg)   SpO2 98%   BMI 22.74 kg/m  Wt Readings from Last 3 Encounters:  01/05/18 143 lb (64.9 kg)  02/23/17 141 lb (64 kg)  02/02/17 140 lb (63.5 kg)    General: Appears her stated age, well developed, well nourished in NAD. Skin: Warm, dry and intact. No rashes, lesions or ulcerations noted. HEENT: Head: normal shape and size; Eyes: sclera white, no icterus, conjunctiva pink, PERRLA and EOMs intact; Ears: Tm's gray and intact, normal light reflex; Throat/Mouth: Teeth present, mucosa pink and moist, no exudate, lesions or ulcerations  noted.  Neck:  Neck supple, trachea midline. No masses, lumps or thyromegaly present.  Cardiovascular: Normal rate and rhythm. S1,S2 noted.  No murmur, rubs or gallops noted. No JVD or BLE edema. No carotid bruits noted. Pulmonary/Chest: Normal effort and positive vesicular breath sounds. No respiratory distress. No wheezes, rales or ronchi noted.  Abdomen: Soft and nontender. Normal bowel sounds. No distention or masses noted. Liver, spleen and kidneys non palpable. Musculoskeletal: Strength 5/5 BUE/BLE. No difficulty with gait.  Neurological: Alert and oriented. Cranial nerves II-XII grossly intact. Coordination normal.  Psychiatric: Mood and affect normal. Behavior is normal. Judgment and thought content normal.     BMET    Component Value Date/Time   NA 143 12/15/2017 1110   K 4.3 12/15/2017 1110   CL 104 12/15/2017 1110   CO2 23 12/15/2017 1110   GLUCOSE 90 12/15/2017 1110   GLUCOSE 91 12/31/2016 1600   BUN 10 12/15/2017 1110   CREATININE 0.71 12/15/2017 1110   CREATININE 0.70 12/31/2016 1600   CALCIUM 9.5 12/15/2017 1110   GFRNONAA 91 12/15/2017 1110   GFRAA 105 12/15/2017 1110    Lipid Panel     Component Value Date/Time   CHOL 201 (H) 12/15/2017 1110   TRIG 71 12/15/2017 1110   HDL 104 12/15/2017 1110   CHOLHDL 1.8 12/31/2016 1600   VLDL 14.4 11/20/2015 1118   LDLCALC 83 12/15/2017 1110   LDLCALC 60 12/31/2016 1600    CBC     Component Value Date/Time   WBC 5.7 12/15/2017 1110   WBC 8.3 12/31/2016 1600   RBC 4.53 12/15/2017 1110   RBC 3.86 12/31/2016 1600   HGB 14.4 12/15/2017 1110   HCT 43.6 12/15/2017 1110   PLT 255 12/15/2017 1110   MCV 96 12/15/2017 1110   MCH 31.8 12/15/2017 1110   MCH 33.2 (H) 12/31/2016 1600   MCHC 33.0 12/15/2017 1110   MCHC 34.7 12/31/2016 1600   RDW 11.9 (L) 12/15/2017 1110   LYMPHSABS 1.7 12/15/2017 1110   MONOABS 0.4 10/05/2010 1154   EOSABS 0.2 12/15/2017 1110   BASOSABS 0.0 12/15/2017 1110    Hgb A1C No results found for: HGBA1C         Assessment & Plan:   Preventative Health Maintenance:  Flu shot today Tetanus UTD Zostovax UTD. Pap smear UTD, will request records Mammogram scheduled Bone density UTD, will request records Colon screening UTD Encouraged her to consume a balanced diet and exercise regimen Advised her to see an eye doctor and dentist annually Labs reviewed with patient  RTC in 1 year, sooner if needed Nicki Reaper, NP

## 2018-01-05 NOTE — Assessment & Plan Note (Signed)
Continue Prolia Encouraged weight bearing exercise

## 2018-01-05 NOTE — Patient Instructions (Signed)
Health Maintenance for Postmenopausal Women Menopause is a normal process in which your reproductive ability comes to an end. This process happens gradually over a span of months to years, usually between the ages of 22 and 9. Menopause is complete when you have missed 12 consecutive menstrual periods. It is important to talk with your health care provider about some of the most common conditions that affect postmenopausal women, such as heart disease, cancer, and bone loss (osteoporosis). Adopting a healthy lifestyle and getting preventive care can help to promote your health and wellness. Those actions can also lower your chances of developing some of these common conditions. What should I know about menopause? During menopause, you may experience a number of symptoms, such as:  Moderate-to-severe hot flashes.  Night sweats.  Decrease in sex drive.  Mood swings.  Headaches.  Tiredness.  Irritability.  Memory problems.  Insomnia.  Choosing to treat or not to treat menopausal changes is an individual decision that you make with your health care provider. What should I know about hormone replacement therapy and supplements? Hormone therapy products are effective for treating symptoms that are associated with menopause, such as hot flashes and night sweats. Hormone replacement carries certain risks, especially as you become older. If you are thinking about using estrogen or estrogen with progestin treatments, discuss the benefits and risks with your health care provider. What should I know about heart disease and stroke? Heart disease, heart attack, and stroke become more likely as you age. This may be due, in part, to the hormonal changes that your body experiences during menopause. These can affect how your body processes dietary fats, triglycerides, and cholesterol. Heart attack and stroke are both medical emergencies. There are many things that you can do to help prevent heart disease  and stroke:  Have your blood pressure checked at least every 1-2 years. High blood pressure causes heart disease and increases the risk of stroke.  If you are 53-22 years old, ask your health care provider if you should take aspirin to prevent a heart attack or a stroke.  Do not use any tobacco products, including cigarettes, chewing tobacco, or electronic cigarettes. If you need help quitting, ask your health care provider.  It is important to eat a healthy diet and maintain a healthy weight. ? Be sure to include plenty of vegetables, fruits, low-fat dairy products, and lean protein. ? Avoid eating foods that are high in solid fats, added sugars, or salt (sodium).  Get regular exercise. This is one of the most important things that you can do for your health. ? Try to exercise for at least 150 minutes each week. The type of exercise that you do should increase your heart rate and make you sweat. This is known as moderate-intensity exercise. ? Try to do strengthening exercises at least twice each week. Do these in addition to the moderate-intensity exercise.  Know your numbers.Ask your health care provider to check your cholesterol and your blood glucose. Continue to have your blood tested as directed by your health care provider.  What should I know about cancer screening? There are several types of cancer. Take the following steps to reduce your risk and to catch any cancer development as early as possible. Breast Cancer  Practice breast self-awareness. ? This means understanding how your breasts normally appear and feel. ? It also means doing regular breast self-exams. Let your health care provider know about any changes, no matter how small.  If you are 40  or older, have a clinician do a breast exam (clinical breast exam or CBE) every year. Depending on your age, family history, and medical history, it may be recommended that you also have a yearly breast X-ray (mammogram).  If you  have a family history of breast cancer, talk with your health care provider about genetic screening.  If you are at high risk for breast cancer, talk with your health care provider about having an MRI and a mammogram every year.  Breast cancer (BRCA) gene test is recommended for women who have family members with BRCA-related cancers. Results of the assessment will determine the need for genetic counseling and BRCA1 and for BRCA2 testing. BRCA-related cancers include these types: ? Breast. This occurs in males or females. ? Ovarian. ? Tubal. This may also be called fallopian tube cancer. ? Cancer of the abdominal or pelvic lining (peritoneal cancer). ? Prostate. ? Pancreatic.  Cervical, Uterine, and Ovarian Cancer Your health care provider may recommend that you be screened regularly for cancer of the pelvic organs. These include your ovaries, uterus, and vagina. This screening involves a pelvic exam, which includes checking for microscopic changes to the surface of your cervix (Pap test).  For women ages 21-65, health care providers may recommend a pelvic exam and a Pap test every three years. For women ages 79-65, they may recommend the Pap test and pelvic exam, combined with testing for human papilloma virus (HPV), every five years. Some types of HPV increase your risk of cervical cancer. Testing for HPV may also be done on women of any age who have unclear Pap test results.  Other health care providers may not recommend any screening for nonpregnant women who are considered low risk for pelvic cancer and have no symptoms. Ask your health care provider if a screening pelvic exam is right for you.  If you have had past treatment for cervical cancer or a condition that could lead to cancer, you need Pap tests and screening for cancer for at least 20 years after your treatment. If Pap tests have been discontinued for you, your risk factors (such as having a new sexual partner) need to be  reassessed to determine if you should start having screenings again. Some women have medical problems that increase the chance of getting cervical cancer. In these cases, your health care provider may recommend that you have screening and Pap tests more often.  If you have a family history of uterine cancer or ovarian cancer, talk with your health care provider about genetic screening.  If you have vaginal bleeding after reaching menopause, tell your health care provider.  There are currently no reliable tests available to screen for ovarian cancer.  Lung Cancer Lung cancer screening is recommended for adults 69-62 years old who are at high risk for lung cancer because of a history of smoking. A yearly low-dose CT scan of the lungs is recommended if you:  Currently smoke.  Have a history of at least 30 pack-years of smoking and you currently smoke or have quit within the past 15 years. A pack-year is smoking an average of one pack of cigarettes per day for one year.  Yearly screening should:  Continue until it has been 15 years since you quit.  Stop if you develop a health problem that would prevent you from having lung cancer treatment.  Colorectal Cancer  This type of cancer can be detected and can often be prevented.  Routine colorectal cancer screening usually begins at  age 42 and continues through age 45.  If you have risk factors for colon cancer, your health care provider may recommend that you be screened at an earlier age.  If you have a family history of colorectal cancer, talk with your health care provider about genetic screening.  Your health care provider may also recommend using home test kits to check for hidden blood in your stool.  A small camera at the end of a tube can be used to examine your colon directly (sigmoidoscopy or colonoscopy). This is done to check for the earliest forms of colorectal cancer.  Direct examination of the colon should be repeated every  5-10 years until age 71. However, if early forms of precancerous polyps or small growths are found or if you have a family history or genetic risk for colorectal cancer, you may need to be screened more often.  Skin Cancer  Check your skin from head to toe regularly.  Monitor any moles. Be sure to tell your health care provider: ? About any new moles or changes in moles, especially if there is a change in a mole's shape or color. ? If you have a mole that is larger than the size of a pencil eraser.  If any of your family members has a history of skin cancer, especially at a young age, talk with your health care provider about genetic screening.  Always use sunscreen. Apply sunscreen liberally and repeatedly throughout the day.  Whenever you are outside, protect yourself by wearing long sleeves, pants, a wide-brimmed hat, and sunglasses.  What should I know about osteoporosis? Osteoporosis is a condition in which bone destruction happens more quickly than new bone creation. After menopause, you may be at an increased risk for osteoporosis. To help prevent osteoporosis or the bone fractures that can happen because of osteoporosis, the following is recommended:  If you are 46-71 years old, get at least 1,000 mg of calcium and at least 600 mg of vitamin D per day.  If you are older than age 55 but younger than age 65, get at least 1,200 mg of calcium and at least 600 mg of vitamin D per day.  If you are older than age 54, get at least 1,200 mg of calcium and at least 800 mg of vitamin D per day.  Smoking and excessive alcohol intake increase the risk of osteoporosis. Eat foods that are rich in calcium and vitamin D, and do weight-bearing exercises several times each week as directed by your health care provider. What should I know about how menopause affects my mental health? Depression may occur at any age, but it is more common as you become older. Common symptoms of depression  include:  Low or sad mood.  Changes in sleep patterns.  Changes in appetite or eating patterns.  Feeling an overall lack of motivation or enjoyment of activities that you previously enjoyed.  Frequent crying spells.  Talk with your health care provider if you think that you are experiencing depression. What should I know about immunizations? It is important that you get and maintain your immunizations. These include:  Tetanus, diphtheria, and pertussis (Tdap) booster vaccine.  Influenza every year before the flu season begins.  Pneumonia vaccine.  Shingles vaccine.  Your health care provider may also recommend other immunizations. This information is not intended to replace advice given to you by your health care provider. Make sure you discuss any questions you have with your health care provider. Document Released: 03/26/2005  Document Revised: 08/22/2015 Document Reviewed: 11/05/2014 Elsevier Interactive Patient Education  2018 [employer] Inc[employer]  

## 2018-01-05 NOTE — Assessment & Plan Note (Signed)
Continue Diclofenac prn

## 2018-01-09 ENCOUNTER — Other Ambulatory Visit: Payer: Self-pay | Admitting: Internal Medicine

## 2018-03-07 ENCOUNTER — Ambulatory Visit: Payer: Self-pay

## 2018-03-08 ENCOUNTER — Ambulatory Visit: Payer: Self-pay | Admitting: Adult Health

## 2018-03-08 VITALS — BP 128/78 | HR 82 | Temp 98.3°F | Resp 14

## 2018-03-08 DIAGNOSIS — J189 Pneumonia, unspecified organism: Secondary | ICD-10-CM

## 2018-03-08 DIAGNOSIS — J181 Lobar pneumonia, unspecified organism: Secondary | ICD-10-CM

## 2018-03-08 DIAGNOSIS — R059 Cough, unspecified: Secondary | ICD-10-CM

## 2018-03-08 DIAGNOSIS — R05 Cough: Secondary | ICD-10-CM

## 2018-03-08 DIAGNOSIS — J0100 Acute maxillary sinusitis, unspecified: Secondary | ICD-10-CM

## 2018-03-08 MED ORDER — PREDNISONE 10 MG (21) PO TBPK: ORAL_TABLET | ORAL | 0 refills | Status: DC

## 2018-03-08 MED ORDER — AMOXICILLIN-POT CLAVULANATE 875-125 MG PO TABS: 1.0000 | ORAL_TABLET | Freq: Two times a day (BID) | ORAL | 0 refills | Status: DC

## 2018-03-08 NOTE — Progress Notes (Signed)
Subjective:     Patient ID: Augusto GarbeNancy Kutscher, female   DOB: 15-Oct-1955, 63 y.o.   MRN: [id number]  Blood pressure 128/78, pulse 82, temperature 98.3 F (36.8 C), temperature source Oral, resp. rate 14, SpO2 95 %. Cough  This is a new problem. The current episode started 1 to 4 weeks ago (3 weeks ago ). The problem has been gradually improving. The problem occurs every few hours. The cough is productive of sputum (clear ). Associated symptoms include ear pain, postnasal drip, rhinorrhea and shortness of breath. Pertinent negatives include no chest pain, chills, ear congestion, fever, headaches, heartburn, hemoptysis, myalgias, nasal congestion, rash, sore throat, sweats, weight loss or wheezing. Associated symptoms comments: Chills resolved on 1st week . Nothing aggravates the symptoms. Treatments tried: nasal saline / flonase  / mucinex  The treatment provided mild relief. Her past medical history is significant for pneumonia (as teenager ). There is no history of asthma, bronchiectasis, bronchitis, COPD, emphysema or environmental allergies.    Blood pressure 128/78, pulse 82, temperature 98.3 F (36.8 C), temperature source Oral, resp. rate 14, SpO2 95 %.  Patient  Currently denies any fever, body aches,chills, rash, chest pain, shortness of breath, nausea, vomiting, or diarrhea.  No edema or leg pain.    Review of Systems  Constitutional: Positive for fatigue (mild ). Negative for activity change, appetite change, chills, diaphoresis, fever, unexpected weight change and weight loss.  HENT: Positive for ear pain, postnasal drip and rhinorrhea. Negative for congestion, dental problem, drooling, ear discharge, facial swelling, hearing loss, mouth sores, nosebleeds, sinus pressure, sinus pain, sneezing, sore throat, tinnitus, trouble swallowing and voice change.   Respiratory: Positive for cough, chest tightness (mild points to bronchial area / intermittent with cough and congestion feels better when  she coughs up sputum she reports sputum is clear ) and shortness of breath. Negative for apnea, hemoptysis, choking, wheezing and stridor.   Cardiovascular: Negative for chest pain, palpitations and leg swelling.  Gastrointestinal: Negative.  Negative for heartburn.  Genitourinary: Negative.   Musculoskeletal: Negative.  Negative for myalgias.  Skin: Negative.  Negative for rash.  Allergic/Immunologic: Negative for environmental allergies.  Neurological: Negative.  Negative for headaches.  Psychiatric/Behavioral: Negative.        Objective:   Physical Exam Vitals signs reviewed.  Constitutional:      General: She is awake. She is not in acute distress.    Appearance: Normal appearance. She is well-groomed and normal weight. She is not ill-appearing, toxic-appearing or diaphoretic.     Interventions: She is not intubated.    Comments: Patient is alert and oriented and responsive to questions Engages in eye contact with provider. Speaks in full sentences without any pauses without any shortness of breath or distress.    HENT:     Head: Normocephalic and atraumatic.     Right Ear: Hearing, ear canal and external ear normal. A middle ear effusion (yellow tinged fluid behind intact tympanic membrane ) is present. There is no impacted cerumen. Tympanic membrane is erythematous.     Left Ear: Hearing, ear canal and external ear normal. A middle ear effusion is present. There is no impacted cerumen. Tympanic membrane is erythematous.     Nose: Congestion and rhinorrhea present.     Right Sinus: Maxillary sinus tenderness present. No frontal sinus tenderness.     Left Sinus: Maxillary sinus tenderness present. No frontal sinus tenderness.     Mouth/Throat:     Mouth: Mucous membranes are moist.  Pharynx: Oropharynx is clear. Uvula midline. Posterior oropharyngeal erythema present. No pharyngeal swelling or oropharyngeal exudate.  Eyes:     General: No scleral icterus.       Right eye: No  discharge.        Left eye: No discharge.     Conjunctiva/sclera: Conjunctivae normal.     Pupils: Pupils are equal, round, and reactive to light.  Neck:     Musculoskeletal: Normal range of motion and neck supple. No neck rigidity or muscular tenderness.     Vascular: No carotid bruit.  Cardiovascular:     Rate and Rhythm: Normal rate and regular rhythm.     Heart sounds: No murmur. No friction rub. No gallop.   Pulmonary:     Effort: Pulmonary effort is normal. No tachypnea, bradypnea, accessory muscle usage, prolonged expiration, respiratory distress or retractions. She is not intubated.     Breath sounds: Normal air entry. No stridor or decreased air movement. No decreased breath sounds, wheezing or rales. Rhonchi: left lower lobe - mild      Comments: No other adventitious sounds auscultated.  Chest:     Chest wall: No tenderness.  Abdominal:     Palpations: Abdomen is soft.  Musculoskeletal: Normal range of motion.     Right lower leg: No edema.     Left lower leg: No edema.     Comments: Patient moves on and off of exam table and in room without difficulty. Gait is normal in hall and in room. Patient is oriented to person place time and situation. Patient answers questions appropriately and engages in conversation.   Lymphadenopathy:     Head:     Right side of head: No submental, submandibular, tonsillar, preauricular, posterior auricular or occipital adenopathy.     Left side of head: No submental, submandibular, tonsillar, preauricular, posterior auricular or occipital adenopathy.     Cervical: No cervical adenopathy.     Right cervical: No superficial, deep or posterior cervical adenopathy.    Left cervical: No superficial, deep or posterior cervical adenopathy.  Skin:    General: Skin is warm and dry.  Neurological:     Mental Status: She is alert and oriented to person, place, and time.  Psychiatric:        Mood and Affect: Mood normal.        Behavior: Behavior  normal. Behavior is cooperative.        Judgment: Judgment normal.        Assessment:     Cough - Plan: DG Chest 2 View  Pneumonia of left lower lobe due to infectious organism [facility])- suspected   Acute non-recurrent maxillary sinusitis      Plan:     Meds ordered this encounter  Medications  . amoxicillin-clavulanate (AUGMENTIN) 875-125 MG tablet    Sig: Take 1 tablet by mouth 2 (two) times daily.    Dispense:  20 tablet    Refill:  0  . predniSONE (STERAPRED UNI-PAK 21 TAB) 10 MG (21) TBPK tablet    Sig: PO: Take 6 tablets on day 1:Take 5 tablets day 2:Take 4 tablets day 3: Take 3 tablets day 4:Take 2 tablets day five: 5 Take 1 tablet day 6    Dispense:  21 tablet    Refill:  0    Orders Placed This Encounter  Procedures  . DG Chest 2 View    Standing Status:   Future    Standing Expiration Date:   05/14/2019  Order Specific Question:   Reason for Exam (SYMPTOM  OR DIAGNOSIS REQUIRED)    Answer:   left lower lobe mild crackles/ cough x 3 weeks    Order Specific Question:   Preferred imaging location?    Answer:   [facility]    Order Specific Question:   Call Results- Best Contact Number?    Answer:   [phone]    Order Specific Question:   Radiology Contrast Protocol - do NOT remove file path    Answer:   [email or web address]    Patient declines chest x ray. Discussed RED Flags and signs of worsening infection such as fever, chills, pain and those listed in the handout given( AVS)  and Gave and reviewed After Visit Summary(AVS) with patient.  Questions answered and encouraged.   Return in about 2 days (around 03/10/2018), or recheck on Friday  03/08/17 and sooner if worsens , for at any time for any worsening symptoms, Call 911 for emergencies.  or follow up with your primary care providers   Advised patient call the office or your primary care doctor for an appointment if no improvement within 72 hours or if any  symptoms change or worsen at any time  Advised ER or urgent Care if after hours or on weekend. Call 911 for emergency symptoms at any time.Patinet verbalized understanding of all instructions given/reviewed and treatment plan and has no further questions or concerns at this time.

## 2018-03-08 NOTE — Patient Instructions (Signed)
Upper Respiratory Infection, Adult An upper respiratory infection (URI) affects the nose, throat, and upper air passages. URIs are caused by germs (viruses). The most common type of URI is often called "the common cold." Medicines cannot cure URIs, but you can do things at home to relieve your symptoms. URIs usually get better within 7-10 days. Follow these instructions at home: Activity  Rest as needed.  If you have a fever, stay home from work or school until your fever is gone, or until your doctor says you may return to work or school. ? You should stay home until you cannot spread the infection anymore (you are not contagious). ? Your doctor may have you wear a face mask so you have less risk of spreading the infection. Relieving symptoms  Gargle with a salt-water mixture 3-4 times a day or as needed. To make a salt-water mixture, completely dissolve -1 tsp of salt in 1 cup of warm water.  Use a cool-mist humidifier to add moisture to the air. This can help you breathe more easily. Eating and drinking   Drink enough fluid to keep your pee (urine) pale yellow.  Eat soups and other clear broths. General instructions   Take over-the-counter and prescription medicines only as told by your doctor. These include cold medicines, fever reducers, and cough suppressants.  Do not use any products that contain nicotine or tobacco. These include cigarettes and e-cigarettes. If you need help quitting, ask your doctor.  Avoid being where people are smoking (avoid secondhand smoke).  Make sure you get regular shots and get the flu shot every year.  Keep all follow-up visits as told by your doctor. This is important. How to avoid spreading infection to others   Wash your hands often with soap and water. If you do not have soap and water, use hand sanitizer.  Avoid touching your mouth, face, eyes, or nose.  Cough or sneeze into a tissue or your sleeve or elbow. Do not cough or  sneeze into your hand or into the air. Contact a doctor if:  You are getting worse, not better.  You have any of these: ? A fever. ? Chills. ? Brown or red mucus in your nose. ? Yellow or brown fluid (discharge)coming from your nose. ? Pain in your face, especially when you bend forward. ? Swollen neck glands. ? Pain with swallowing. ? White areas in the back of your throat. Get help right away if:  You have shortness of breath that gets worse.  You have very bad or constant: ? Headache. ? Ear pain. ? Pain in your forehead, behind your eyes, and over your cheekbones (sinus pain). ? Chest pain.  You have long-lasting (chronic) lung disease along with any of these: ? Wheezing. ? Long-lasting cough. ? Coughing up blood. ? A change in your usual mucus.  You have a stiff neck.  You have changes in your: ? Vision. ? Hearing. ? Thinking. ? Mood. Summary  An upper respiratory infection (URI) is caused by a germ called a virus. The most common type of URI is often called "the common cold."  URIs usually get better within 7-10 days.  Take over-the-counter and prescription medicines only as told by your doctor. This information is not intended to replace advice given to you by your health care provider. Make sure you discuss any questions you have with your health care provider. Document Released: 07/21/2007 Document Revised: 09/24/2016 Document Reviewed: 09/24/2016 Elsevier Interactive Patient Education  2019  [employer] Community-Acquired Pneumonia, Adult Pneumonia is an infection of the lungs. It causes swelling in the airways of the lungs. Mucus and fluid may also build up inside the airways. One type of pneumonia can happen while a person is in a hospital. A different type can happen when a person is not in a hospital (community-acquired pneumonia).  What are the causes?  This condition is caused by germs (viruses, bacteria, or fungi). Some types of germs can be  passed from one person to another. This can happen when you breathe in droplets from the cough or sneeze of an infected person. What increases the risk? You are more likely to develop this condition if you:  Have a long-term (chronic) disease, such as: ? Chronic obstructive pulmonary disease (COPD). ? Asthma. ? Cystic fibrosis. ? Congestive heart failure. ? Diabetes. ? Kidney disease.  Have HIV.  Have sickle cell disease.  Have had your spleen removed.  Do not take good care of your teeth and mouth (poor dental hygiene).  Have a medical condition that increases the risk of breathing in droplets from your own mouth and nose.  Have a weakened body defense system (immune system).  Are a smoker.  Travel to areas where the germs that cause this illness are common.  Are around certain animals or the places they live. What are the signs or symptoms?  A dry cough.  A wet (productive) cough.  Fever.  Sweating.  Chest pain. This often happens when breathing deeply or coughing.  Fast breathing or trouble breathing.  Shortness of breath.  Shaking chills.  Feeling tired (fatigue).  Muscle aches. How is this treated? Treatment for this condition depends on many things. Most adults can be treated at home. In some cases, treatment must happen in a hospital. Treatment may include:  Medicines given by mouth or through an IV tube.  Being given extra oxygen.  Respiratory therapy. In rare cases, treatment for very bad pneumonia may include:  Using a machine to help you breathe.  Having a procedure to remove fluid from around your lungs. Follow these instructions at home: Medicines  Take over-the-counter and prescription medicines only as told by your doctor. ? Only take cough medicine if you are losing sleep.  If you were prescribed an antibiotic medicine, take it as told by your doctor. Do not stop taking the antibiotic even if you start to feel better. General  instructions   Sleep with your head and neck raised (elevated). You can do this by sleeping in a recliner or by putting a few pillows under your head.  Rest as needed. Get at least 8 hours of sleep each night.  Drink enough water to keep your pee (urine) pale yellow.  Eat a healthy diet that includes plenty of vegetables, fruits, whole grains, low-fat dairy products, and lean protein.  Do not use any products that contain nicotine or tobacco. These include cigarettes, e-cigarettes, and chewing tobacco. If you need help quitting, ask your doctor.  Keep all follow-up visits as told by your doctor. This is important. How is this prevented? A shot (vaccine) can help prevent pneumonia. Shots are often suggested for:  People older than 64 years of age.  People older than 64 years of age who: ? Are having cancer treatment. ? Have long-term (chronic) lung disease. ? Have problems with their body's defense system. You may also prevent pneumonia if you take these actions:  Get the flu (influenza) shot every year.  Go to  the dentist as often as told.  Wash your hands often. If you cannot use soap and water, use hand sanitizer. Contact a doctor if:  You have a fever.  You lose sleep because your cough medicine does not help. Get help right away if:  You are short of breath and it gets worse.  You have more chest pain.  Your sickness gets worse. This is very serious if: ? You are an older adult. ? Your body's defense system is weak.  You cough up blood. Summary  Pneumonia is an infection of the lungs.  Most adults can be treated at home. Some will need treatment in a hospital.  Drink enough water to keep your pee pale yellow.  Get at least 8 hours of sleep each night. This information is not intended to replace advice given to you by your health care provider. Make sure you discuss any questions you have with your health care provider. Document Released: 07/21/2007 Document  Revised: 09/29/2017 Document Reviewed: 09/29/2017 Elsevier Interactive Patient Education  2019 [employer].

## 2018-04-04 ENCOUNTER — Ambulatory Visit: Payer: Managed Care, Other (non HMO) | Admitting: Internal Medicine

## 2018-04-04 VITALS — BP 118/80 | HR 72 | Temp 97.9°F | Wt 146.0 lb

## 2018-04-04 DIAGNOSIS — G5601 Carpal tunnel syndrome, right upper limb: Secondary | ICD-10-CM

## 2018-04-04 MED ORDER — PREDNISONE 10 MG PO TABS: ORAL_TABLET | ORAL | 0 refills | Status: DC

## 2018-04-04 NOTE — Patient Instructions (Signed)
Carpal Tunnel Syndrome    Carpal tunnel syndrome is a condition that causes pain in your hand and arm. The carpal tunnel is a narrow area that is on the palm side of your wrist. Repeated wrist motion or certain diseases may cause swelling in the tunnel. This swelling can pinch the main nerve in the wrist (median nerve).  What are the causes?  This condition may be caused by:   Repeated wrist motions.   Wrist injuries.   Arthritis.   A sac of fluid (cyst) or abnormal growth (tumor) in the carpal tunnel.   Fluid buildup during pregnancy.  Sometimes the cause is not known.  What increases the risk?  The following factors may make you more likely to develop this condition:   Having a job in which you move your wrist in the same way many times. This includes jobs like being a butcher or a cashier.   Being a woman.   Having other health conditions, such as:  ? Diabetes.  ? Obesity.  ? A thyroid gland that is not active enough (hypothyroidism).  ? Kidney failure.  What are the signs or symptoms?  Symptoms of this condition include:   A tingling feeling in your fingers.   Tingling or a loss of feeling (numbness) in your hand.   Pain in your entire arm. This pain may get worse when you bend your wrist and elbow for a long time.   Pain in your wrist that goes up your arm to your shoulder.   Pain that goes down into your palm or fingers.   A weak feeling in your hands. You may find it hard to grab and hold items.  You may feel worse at night.  How is this diagnosed?  This condition is diagnosed with a medical history and physical exam. You may also have tests, such as:   Electromyogram (EMG). This test checks the signals that the nerves send to the muscles.   Nerve conduction study. This test checks how well signals pass through your nerves.   Imaging tests, such as X-rays, ultrasound, and MRI. These tests check for what might be the cause of your condition.  How is this treated?  This condition may be treated  with:   Lifestyle changes. You will be asked to stop or change the activity that caused your problem.   Doing exercise and activities that make bones and muscles stronger (physical therapy).   Learning how to use your hand again (occupational therapy).   Medicines for pain and swelling (inflammation). You may have injections in your wrist.   A wrist splint.   Surgery.  Follow these instructions at home:  If you have a splint:   Wear the splint as told by your doctor. Remove it only as told by your doctor.   Loosen the splint if your fingers:  ? Tingle.  ? Lose feeling (become numb).  ? Turn cold and blue.   Keep the splint clean.   If the splint is not waterproof:  ? Do not let it get wet.  ? Cover it with a watertight covering when you take a bath or a shower.  Managing pain, stiffness, and swelling     If told, put ice on the painful area:  ? If you have a removable splint, remove it as told by your doctor.  ? Put ice in a plastic bag.  ? Place a towel between your skin and the bag.  ? Leave the   ice on for 20 minutes, 2-3 times per day.  General instructions   Take over-the-counter and prescription medicines only as told by your doctor.   Rest your wrist from any activity that may cause pain. If needed, talk with your boss at work about changes that can help your wrist heal.   Do any exercises as told by your doctor, physical therapist, or occupational therapist.   Keep all follow-up visits as told by your doctor. This is important.  Contact a doctor if:   You have new symptoms.   Medicine does not help your pain.   Your symptoms get worse.  Get help right away if:   You have very bad numbness or tingling in your wrist or hand.  Summary   Carpal tunnel syndrome is a condition that causes pain in your hand and arm.   It is often caused by repeated wrist motions.   Lifestyle changes and medicines are used to treat this problem. Surgery may help in very bad cases.   Follow your doctor's  instructions about wearing a splint, resting your wrist, keeping follow-up visits, and calling for help.  This information is not intended to replace advice given to you by your health care provider. Make sure you discuss any questions you have with your health care provider.  Document Released: 01/21/2011 Document Revised: 06/10/2017 Document Reviewed: 06/10/2017  Elsevier Interactive Patient Education  2019 Elsevier Inc.

## 2018-04-06 ENCOUNTER — Encounter: Payer: Self-pay | Admitting: Internal Medicine

## 2018-04-06 NOTE — Progress Notes (Signed)
Subjective:    Patient ID: Augusto GarbeNancy Coster, female    DOB: [birth year], 63 y.o.   MRN: [id number]  HPI  Pt presents to the clinic today with c/o numbness and tingling in her right hand. She reports this started 3 weeks ago. It is intermittent. She has some sharp pain that radiates up the forearm. She denies weakness. She is right handed and works in [employer]. She denies any injury to the area. She has tried Aleve with minimal relief.  Review of Systems      Past Medical History:  Diagnosis Date  . ALLERGIC RHINITIS 01/30/2007  . Allergy   . ANEMIA-NOS 01/30/2007  . CAROTID BRUIT, RIGHT 01/30/2007  . DIVERTICULUM, BLADDER 01/30/2007  . HEART MURMUR, HX OF 01/30/2007  . Unspecified hypothyroidism 02/25/2009  . UTI'S, HX OF 01/30/2007    Current Outpatient Medications  Medication Sig Dispense Refill  . Calcium Carbonate (CALCIUM 600 PO) Take by mouth.    . Cholecalciferol (VITAMIN D3) 125 MCG (5000 UT) CAPS Take by mouth.    . ciprofloxacin (CIPRO) 500 MG tablet Take 500 mg by mouth as needed (after sex).    Marland Kitchen. denosumab (PROLIA) 60 MG/ML SOLN injection Inject 60 mg into the skin every 6 (six) months. Administer in upper arm, thigh, or abdomen    . Diclofenac Sodium 2 % SOLN Place 2 g onto the skin 2 (two) times daily. 112 g 3  . estradiol (VAGIFEM) 25 MCG vaginal tablet Place 25 mcg vaginally 2 (two) times a week.    . levothyroxine (SYNTHROID, LEVOTHROID) 75 MCG tablet Take 1 tablet (75 mcg total) by mouth daily before breakfast. 90 tablet 2  . loratadine (CLARITIN) 10 MG tablet Take 10 mg by mouth as needed.      . naproxen sodium (ANAPROX) 220 MG tablet Take 220 mg by mouth 2 (two) times daily with a meal.    . pseudoephedrine (SUDAFED) 30 MG tablet Take 30 mg by mouth as needed.      . predniSONE (DELTASONE) 10 MG tablet Take 3 tabs on days 1-3, take 2 tabs on days 4-6, take 1 tab on days 7-9 18 tablet 0   Current Facility-Administered Medications  Medication Dose Route Frequency  Provider Last Rate Last Dose  . 0.9 %  sodium chloride infusion  500 mL Intravenous Continuous Nandigam, Eleonore ChiquitoKavitha V, MD        Allergies  Allergen Reactions  . Macrobid [Nitrofurantoin Monohyd Macro] Hives    Family History  Problem Relation Age of Onset  . Hypertension Mother   . Thyroid disease Mother        hypothyroid  . Alcohol abuse Mother   . Stroke Mother   . Alcohol abuse Father   . Arthritis Brother   . Depression Brother   . Colon polyps Brother   . Diabetes Other   . Cancer Maternal Aunt        bone cancer  . Arthritis Maternal Aunt        RA  . Colon cancer Neg Hx     Social History   Socioeconomic History  . Marital status: Married    Spouse name: Not on file  . Number of children: 2  . Years of education: 6116  . Highest education level: Not on file  Occupational History  . 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 Financial resource strain: Not on file  . Food insecurity:    Worry: Not on file    Inability:  Not on file  . Transportation needs:    Medical: Not on file    Non-medical: Not on file  Tobacco Use  . Smoking status: Never Smoker  . Smokeless tobacco: Never Used  Substance and Sexual Activity  . Alcohol use: Yes    Alcohol/week: 6.0 standard drinks    Types: 6 Standard drinks or equivalent per week    Comment: occasional  . Drug use: No  . Sexual activity: Yes    Partners: Male  Lifestyle  . Physical activity:    Days per week: Not on file    Minutes per session: Not on file  . Stress: Not on file  Relationships  . Social connections:    Talks on phone: Not on file    Gets together: Not on file    Attends religious service: Not on file    Active member of club or organization: Not on file    Attends meetings of clubs or organizations: Not on file    Relationship status: Not on file  . Intimate partner violence:    Fear of current or ex partner: Not on file    Emotionally abused: Not on file    Physically abused:  Not on file    Forced sexual activity: Not on file  Other Topics Concern  . Not on file  Social History Narrative   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 Married 1979. 1 daughter 1985-married RN; 1 son 69 [place]-graduated, works [place]-married '14. 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 SO in good health     Constitutional: Denies fever, malaise, fatigue, headache or abrupt weight changes.  Respiratory: Denies difficulty breathing, shortness of breath, cough or sputum production.   Cardiovascular: Denies chest pain, chest tightness, palpitations or swelling in the hands or feet.  Musculoskeletal: Pt reports right hand pain. Denies decrease in range of motion, difficulty with gait, muscle pain or joint swelling.  Neurological: Pt reports numbness and tingling of right hand. Denies dizziness, difficulty with memory, difficulty with speech or problems with balance and coordination.    No other specific complaints in a complete review of systems (except as listed in HPI above).  Objective:   Physical Exam  BP 118/80   Pulse 72   Temp 97.9 F (36.6 C) (Oral)   Wt 146 lb (66.2 kg)   SpO2 98%   BMI 23.21 kg/m  Wt Readings from Last 3 Encounters:  04/04/18 146 lb (66.2 kg)  01/05/18 143 lb (64.9 kg)  02/23/17 141 lb (64 kg)    General: Appears her stated age, well developed, well nourished in NAD. Cardiovascular: Normal rate and rhythm. S1,S2 noted.  No murmur, rubs or gallops noted. Radial pulses 2+ Pulmonary/Chest: Normal effort and positive vesicular breath sounds. No respiratory distress. No wheezes, rales or ronchi noted.  Musculoskeletal: Normal flexion, extension and rotation of the right wrist. No joint swelling noted. Hand grips equal. Neurological: Alert and oriented. Positive Phalen's. Positive Tinel's.   BMET    Component Value Date/Time   NA 143 12/15/2017 1110   K 4.3 12/15/2017 1110   CL 104 12/15/2017 1110   CO2 23 12/15/2017 1110   GLUCOSE  90 12/15/2017 1110   GLUCOSE 91 12/31/2016 1600   BUN 10 12/15/2017 1110   CREATININE 0.71 12/15/2017 1110   CREATININE 0.70 12/31/2016 1600   CALCIUM 9.5 12/15/2017 1110   GFRNONAA 91 12/15/2017 1110   GFRAA 105 12/15/2017 1110    Lipid Panel  Component Value Date/Time   CHOL 201 (H) 12/15/2017 1110   TRIG 71 12/15/2017 1110   HDL 104 12/15/2017 1110   CHOLHDL 1.8 12/31/2016 1600   VLDL 14.4 11/20/2015 1118   LDLCALC 83 12/15/2017 1110   LDLCALC 60 12/31/2016 1600    CBC    Component Value Date/Time   WBC 5.7 12/15/2017 1110   WBC 8.3 12/31/2016 1600   RBC 4.53 12/15/2017 1110   RBC 3.86 12/31/2016 1600   HGB 14.4 12/15/2017 1110   HCT 43.6 12/15/2017 1110   PLT 255 12/15/2017 1110   MCV 96 12/15/2017 1110   MCH 31.8 12/15/2017 1110   MCH 33.2 (H) 12/31/2016 1600   MCHC 33.0 12/15/2017 1110   MCHC 34.7 12/31/2016 1600   RDW 11.9 (L) 12/15/2017 1110   LYMPHSABS 1.7 12/15/2017 1110   MONOABS 0.4 10/05/2010 1154   EOSABS 0.2 12/15/2017 1110   BASOSABS 0.0 12/15/2017 1110    Hgb A1C No results found for: HGBA1C          Assessment & Plan:   Carpal Tunnel, Right:  RX for Pred Taper x 9 days Advised her to get a cock up splint and wear while working If no improvement, consider referral to neurology for EMG testing vs referral to hand surgery  Return precautions discussed Nicki Reaper, NP

## 2018-06-15 ENCOUNTER — Telehealth: Payer: Self-pay | Admitting: Internal Medicine

## 2018-06-15 NOTE — Telephone Encounter (Signed)
Prolia prior auth submitted to [place].

## 2018-07-12 ENCOUNTER — Ambulatory Visit (INDEPENDENT_AMBULATORY_CARE_PROVIDER_SITE_OTHER): Payer: Managed Care, Other (non HMO)

## 2018-07-12 DIAGNOSIS — M810 Age-related osteoporosis without current pathological fracture: Secondary | ICD-10-CM

## 2018-07-12 MED ORDER — DENOSUMAB 60 MG/ML ~~LOC~~ SOSY
60.0000 mg | PREFILLED_SYRINGE | Freq: Once | SUBCUTANEOUS | Status: AC
  Administered 2018-07-12: 60 mg via SUBCUTANEOUS

## 2018-07-12 NOTE — Progress Notes (Signed)
Per orders of [employer] injection of Prolia given by [employer]. Patient tolerated injection well.

## 2018-07-12 NOTE — Progress Notes (Signed)
Pt given Prolia injection by CMA. Last Calcium level 9.5, 11/2017. Last bone density 11/2011, will plan to repeat bone density this year. Nicki Reaper, NP

## 2018-07-12 NOTE — Telephone Encounter (Signed)
FYI: Patient received Prolia injection today.

## 2018-12-06 ENCOUNTER — Telehealth: Payer: Self-pay

## 2018-12-06 DIAGNOSIS — Z5181 Encounter for therapeutic drug level monitoring: Secondary | ICD-10-CM

## 2018-12-06 NOTE — Telephone Encounter (Signed)
ordered

## 2018-12-06 NOTE — Telephone Encounter (Signed)
Pt due for next Prolia injection 12-31-2018.  Need order for calcium lab.

## 2018-12-06 NOTE — Addendum Note (Signed)
Addended by: Jearld Fenton on: 12/06/2018 03:26 PM   Modules accepted: Orders

## 2018-12-06 NOTE — Telephone Encounter (Signed)
Correction.  Prolia due 01-13-2019.

## 2018-12-20 NOTE — Telephone Encounter (Signed)
Ok to wait until then?

## 2019-01-08 ENCOUNTER — Encounter: Payer: Self-pay | Admitting: Internal Medicine

## 2019-01-08 ENCOUNTER — Ambulatory Visit (INDEPENDENT_AMBULATORY_CARE_PROVIDER_SITE_OTHER): Payer: Managed Care, Other (non HMO) | Admitting: Internal Medicine

## 2019-01-08 ENCOUNTER — Other Ambulatory Visit: Payer: Self-pay

## 2019-01-08 VITALS — BP 124/78 | HR 76 | Temp 97.7°F | Ht 66.75 in | Wt 143.0 lb

## 2019-01-08 DIAGNOSIS — M810 Age-related osteoporosis without current pathological fracture: Secondary | ICD-10-CM | POA: Diagnosis not present

## 2019-01-08 DIAGNOSIS — M8949 Other hypertrophic osteoarthropathy, multiple sites: Secondary | ICD-10-CM | POA: Diagnosis not present

## 2019-01-08 DIAGNOSIS — E039 Hypothyroidism, unspecified: Secondary | ICD-10-CM

## 2019-01-08 DIAGNOSIS — M159 Polyosteoarthritis, unspecified: Secondary | ICD-10-CM

## 2019-01-08 DIAGNOSIS — Z23 Encounter for immunization: Secondary | ICD-10-CM | POA: Diagnosis not present

## 2019-01-08 DIAGNOSIS — Z0000 Encounter for general adult medical examination without abnormal findings: Secondary | ICD-10-CM

## 2019-01-08 DIAGNOSIS — R200 Anesthesia of skin: Secondary | ICD-10-CM

## 2019-01-08 LAB — COMPREHENSIVE METABOLIC PANEL
ALT: 15 U/L (ref 0–35)
AST: 18 U/L (ref 0–37)
Albumin: 4.1 g/dL (ref 3.5–5.2)
Alkaline Phosphatase: 41 U/L (ref 39–117)
BUN: 10 mg/dL (ref 6–23)
CO2: 27 mEq/L (ref 19–32)
Calcium: 9.3 mg/dL (ref 8.4–10.5)
Chloride: 105 mEq/L (ref 96–112)
Creatinine, Ser: 0.75 mg/dL (ref 0.40–1.20)
GFR: 77.64 mL/min (ref >60.00)
Glucose, Bld: 101 mg/dL — ABNORMAL HIGH (ref 70–99)
Potassium: 4.4 mEq/L (ref 3.5–5.1)
Sodium: 140 mEq/L (ref 135–145)
Total Bilirubin: 0.6 mg/dL (ref 0.2–1.2)
Total Protein: 6.9 g/dL (ref 6.0–8.3)

## 2019-01-08 LAB — LIPID PANEL
Cholesterol: 200 mg/dL (ref 0–200)
HDL: 113 mg/dL (ref >39.00)
LDL Cholesterol: 74 mg/dL (ref 0–99)
NonHDL: 86.69
Total CHOL/HDL Ratio: 2
Triglycerides: 63 mg/dL (ref 0.0–149.0)
VLDL: 12.6 mg/dL (ref 0.0–40.0)

## 2019-01-08 LAB — CBC
HCT: 42.5 % (ref 36.0–46.0)
Hemoglobin: 13.8 g/dL (ref 12.0–15.0)
MCHC: 32.6 g/dL (ref 30.0–36.0)
MCV: 101.5 fl — ABNORMAL HIGH (ref 78.0–100.0)
Platelets: 224 10*3/uL (ref 150.0–400.0)
RBC: 4.18 Mil/uL (ref 3.87–5.11)
RDW: 13.1 % (ref 11.5–15.5)
WBC: 6.2 10*3/uL (ref 4.0–10.5)

## 2019-01-08 LAB — TSH: TSH: 1.92 u[IU]/mL (ref 0.35–4.50)

## 2019-01-08 LAB — VITAMIN D 25 HYDROXY (VIT D DEFICIENCY, FRACTURES): VITD: 34.56 ng/mL (ref 30.00–100.00)

## 2019-01-08 LAB — T4, FREE: Free T4: 1.13 ng/dL (ref 0.60–1.60)

## 2019-01-08 NOTE — Assessment & Plan Note (Signed)
Vit D and Calcium level today Continue Calcium, Vit D and Prolia

## 2019-01-08 NOTE — Progress Notes (Signed)
Subjective:    Patient ID: Amber Soto, female    DOB: 1955-05-11, 63 y.o.   MRN: [id number]  HPI  Pt presents to the clinic today for her annual exam. She is also due for follow up of chronic conditions.  Hypothyroidism: She denies any issues on her current dose of Levothyroxine. She will need a refill of medication today  Osteoporosis: Managed on Prolia. Her last bone density exam was in 2015. She is getting 30 minutes of weight bearing exercise daily.   Post Coital UTI: Managed with prophylactic Cipro. She follows with GYN for this.  Flu: 10/2018 Tetanus: < 10 years Zostovax: 11/2015 Pap Smear:  12/2016 Mammogram: 12/2016 Bone Density: 2015 Colon Screening: 10/2015, 10 years Vision Screening: annually Dentist: biannually  Diet: She does eat meat. She consumes fruits and veggies daily. She rarely eats fried foods. She drinks mostly water, wine, beer. Exercise: Walking  Review of Systems   Past Medical History:  Diagnosis Date  . ALLERGIC RHINITIS 01/30/2007  . Allergy   . ANEMIA-NOS 01/30/2007  . CAROTID BRUIT, RIGHT 01/30/2007  . DIVERTICULUM, BLADDER 01/30/2007  . HEART MURMUR, HX OF 01/30/2007  . Unspecified hypothyroidism 02/25/2009  . UTI'S, HX OF 01/30/2007    Current Outpatient Medications  Medication Sig Dispense Refill  . Calcium Carbonate (CALCIUM 600 PO) Take by mouth.    . Cholecalciferol (VITAMIN D3) 125 MCG (5000 UT) CAPS Take by mouth.    . ciprofloxacin (CIPRO) 500 MG tablet Take 500 mg by mouth as needed (after sex).    Marland Kitchen. denosumab (PROLIA) 60 MG/ML SOLN injection Inject 60 mg into the skin every 6 (six) months. Administer in upper arm, thigh, or abdomen    . Diclofenac Sodium 2 % SOLN Place 2 g onto the skin 2 (two) times daily. 112 g 3  . estradiol (VAGIFEM) 25 MCG vaginal tablet Place 25 mcg vaginally 2 (two) times a week.    . levothyroxine (SYNTHROID, LEVOTHROID) 75 MCG tablet Take 1 tablet (75 mcg total) by mouth daily before breakfast. 90  tablet 2  . loratadine (CLARITIN) 10 MG tablet Take 10 mg by mouth as needed.      . naproxen sodium (ANAPROX) 220 MG tablet Take 220 mg by mouth 2 (two) times daily with a meal.    . predniSONE (DELTASONE) 10 MG tablet Take 3 tabs on days 1-3, take 2 tabs on days 4-6, take 1 tab on days 7-9 18 tablet 0  . pseudoephedrine (SUDAFED) 30 MG tablet Take 30 mg by mouth as needed.       Current Facility-Administered Medications  Medication Dose Route Frequency Provider Last Rate Last Dose  . 0.9 %  sodium chloride infusion  500 mL Intravenous Continuous Nandigam, Eleonore ChiquitoKavitha V, MD        Allergies  Allergen Reactions  . Macrobid [Nitrofurantoin Monohyd Macro] Hives    Family History  Problem Relation Age of Onset  . Hypertension Mother   . Thyroid disease Mother        hypothyroid  . Alcohol abuse Mother   . Stroke Mother   . Alcohol abuse Father   . Arthritis Brother   . Depression Brother   . Colon polyps Brother   . Diabetes Other   . Cancer Maternal Aunt        bone cancer  . Arthritis Maternal Aunt        RA  . Colon cancer Neg Hx     Social History   Socioeconomic History  .  Marital status: Married    Spouse name: Not on file  . Number of children: 2  . Years of education: 58  . Highest education level: Not on file  Occupational History  . Occupation: Investment banker, corporate  Social Needs  . Financial resource strain: Not on file  . Food insecurity    Worry: Not on file    Inability: Not on file  . Transportation needs    Medical: Not on file    Non-medical: Not on file  Tobacco Use  . Smoking status: Never Smoker  . Smokeless tobacco: Never Used  Substance and Sexual Activity  . Alcohol use: Yes    Alcohol/week: 6.0 standard drinks    Types: 6 Standard drinks or equivalent per week    Comment: occasional  . Drug use: No  . Sexual activity: Yes    Partners: Male  Lifestyle  . Physical activity    Days per week: Not on file    Minutes per session: Not on  file  . Stress: Not on file  Relationships  . Social Herbalist on phone: Not on file    Gets together: Not on file    Attends religious service: Not on file    Active member of club or organization: Not on file    Attends meetings of clubs or organizations: Not on file    Relationship status: Not on file  . Intimate partner violence    Fear of current or ex partner: Not on file    Emotionally abused: Not on file    Physically abused: Not on file    Forced sexual activity: Not on file  Other Topics Concern  . 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 Married 1979. 1 daughter 1985-married RN; 1 son 12 [place]-graduated, works [employer]-married '14. Work: Transport planner at [employer]. Marriage in good health. SO in good health     Constitutional: Denies fever, malaise, fatigue, headache or abrupt weight changes.  HEENT: Denies eye pain, eye redness, ear pain, ringing in the ears, wax buildup, runny nose, nasal congestion, bloody nose, or sore throat. Respiratory: Denies difficulty breathing, shortness of breath, cough or sputum production.   Cardiovascular: Denies chest pain, chest tightness, palpitations or swelling in the hands or feet.  Gastrointestinal: Denies abdominal pain, bloating, constipation, diarrhea or blood in the stool.  GU: Denies urgency, frequency, pain with urination, burning sensation, blood in urine, odor or discharge. Musculoskeletal: Pt reports intermittent pain in top of left foot. Denies decrease in range of motion, difficulty with gait, muscle pain or joint swelling.  Skin: Denies redness, rashes, lesions or ulcercations.  Neurological: Pt reports intermittent numbness in left leg. Denies dizziness, difficulty with memory, difficulty with speech or problems with balance and coordination.  Psych: Denies anxiety, depression, SI/HI.  No other specific complaints in a complete review of systems (except as listed in HPI  above).     Objective:   Physical Exam  BP 124/78   Pulse 76   Temp 97.7 F (36.5 C) (Temporal)   Ht 5' 6.75" (1.695 m)   Wt 143 lb (64.9 kg)   SpO2 97%   BMI 22.57 kg/m   Wt Readings from Last 3 Encounters:  04/04/18 146 lb (66.2 kg)  01/05/18 143 lb (64.9 kg)  02/23/17 141 lb (64 kg)    General: Appears her stated age, well developed, well nourished in NAD. Skin: Warm, dry and intact. No rashes,  lesions or ulcerations noted. HEENT: Head: normal shape and size; Eyes: sclera white, no icterus, conjunctiva pink, PERRLA and EOMs intact; Ears: Tm's gray and intact, normal light reflex;  Neck:  Neck supple, trachea midline. No masses, lumps present.  Cardiovascular: Normal rate and rhythm. S1,S2 noted.  Murmur noted. No JVD or BLE edema. No carotid bruits noted. Pulmonary/Chest: Normal effort and positive vesicular breath sounds. No respiratory distress. No wheezes, rales or ronchi noted.  Abdomen: Soft and nontender. Normal bowel sounds. No distention or masses noted. Liver, spleen and kidneys non palpable. Musculoskeletal: No bony tenderness noted over the lumbar spine. Strength 5/5 BUE/BLE. No difficulty with gait.  Neurological: Alert and oriented. Cranial nerves II-XII grossly intact. Coordination normal.  Psychiatric: Mood and affect normal. Behavior is normal. Judgment and thought content normal.     BMET    Component Value Date/Time   NA 143 12/15/2017 1110   K 4.3 12/15/2017 1110   CL 104 12/15/2017 1110   CO2 23 12/15/2017 1110   GLUCOSE 90 12/15/2017 1110   GLUCOSE 91 12/31/2016 1600   BUN 10 12/15/2017 1110   CREATININE 0.71 12/15/2017 1110   CREATININE 0.70 12/31/2016 1600   CALCIUM 9.5 12/15/2017 1110   GFRNONAA 91 12/15/2017 1110   GFRAA 105 12/15/2017 1110    Lipid Panel     Component Value Date/Time   CHOL 201 (H) 12/15/2017 1110   TRIG 71 12/15/2017 1110   HDL 104 12/15/2017 1110   CHOLHDL 1.8 12/31/2016 1600   VLDL 14.4 11/20/2015 1118    LDLCALC 83 12/15/2017 1110   LDLCALC 60 12/31/2016 1600    CBC    Component Value Date/Time   WBC 5.7 12/15/2017 1110   WBC 8.3 12/31/2016 1600   RBC 4.53 12/15/2017 1110   RBC 3.86 12/31/2016 1600   HGB 14.4 12/15/2017 1110   HCT 43.6 12/15/2017 1110   PLT 255 12/15/2017 1110   MCV 96 12/15/2017 1110   MCH 31.8 12/15/2017 1110   MCH 33.2 (H) 12/31/2016 1600   MCHC 33.0 12/15/2017 1110   MCHC 34.7 12/31/2016 1600   RDW 11.9 (L) 12/15/2017 1110   LYMPHSABS 1.7 12/15/2017 1110   MONOABS 0.4 10/05/2010 1154   EOSABS 0.2 12/15/2017 1110   BASOSABS 0.0 12/15/2017 1110    Hgb A1C No results found for: HGBA1C         Assessment & Plan:  Preventative Health Maintenance:  Flu shot UTD Tetanus today Zostovax UTD She declines Shingrix at this time She has an appt with GYN schedule for pap smear, mammogram and bone density Colon screening UTD Encouraged her to consume a balanced diet and exercise regimen Advised her to see an eye doctor and dentist annually Will check CBC, CMET, TSH, Free T4, Lipid and Vit D today  Numbness of Left Leg:  Could be early sciatica Encouraged NSAID's OTC Encouraged stretching to relieve symptoms Consider xray lumbar if pain persists or worsens  RTC in 1 year, sooner if needed Nicki Reaper, NP This visit occurred during the SARS-CoV-2 public health emergency.  Safety protocols were in place, including screening questions prior to the visit, additional usage of staff PPE, and extensive cleaning of exam room while observing appropriate contact time as indicated for disinfecting solutions.

## 2019-01-08 NOTE — Addendum Note (Signed)
Addended by: Lurlean Nanny on: 01/08/2019 02:23 PM   Modules accepted: Orders

## 2019-01-08 NOTE — Patient Instructions (Signed)

## 2019-01-08 NOTE — Assessment & Plan Note (Signed)
TSH and Free T4 today Will adjust Levothyroxine if needed based on labs and will refill

## 2019-01-08 NOTE — Assessment & Plan Note (Signed)
Encouraged her to start daily Aleve

## 2019-01-09 ENCOUNTER — Telehealth: Payer: Self-pay

## 2019-01-09 NOTE — Telephone Encounter (Signed)
Discussed Prolia benefits w/pt.  Pt would owe $30 copay. Pt understands and agrees.

## 2019-01-17 ENCOUNTER — Other Ambulatory Visit: Payer: Self-pay | Admitting: Internal Medicine

## 2019-02-06 ENCOUNTER — Other Ambulatory Visit: Payer: Self-pay

## 2019-02-06 MED ORDER — LEVOTHYROXINE SODIUM 75 MCG PO TABS: ORAL_TABLET | ORAL | 0 refills | Status: DC

## 2019-02-06 NOTE — Telephone Encounter (Signed)
Pt is waiting on mail order delivery of levothyroxine 75 mcg. Pt request levothyroxine 75 mcg to [facility]; pthas one more pill left/ refilled per protocol and pt voiced understanding and appreciative. Nothing further needed.

## 2019-02-14 ENCOUNTER — Other Ambulatory Visit: Payer: Self-pay

## 2019-02-14 ENCOUNTER — Ambulatory Visit (INDEPENDENT_AMBULATORY_CARE_PROVIDER_SITE_OTHER): Payer: Managed Care, Other (non HMO) | Admitting: *Deleted

## 2019-02-14 DIAGNOSIS — M8949 Other hypertrophic osteoarthropathy, multiple sites: Secondary | ICD-10-CM

## 2019-02-14 DIAGNOSIS — M159 Polyosteoarthritis, unspecified: Secondary | ICD-10-CM

## 2019-02-14 MED ORDER — DENOSUMAB 60 MG/ML ~~LOC~~ SOSY
60.0000 mg | PREFILLED_SYRINGE | Freq: Once | SUBCUTANEOUS | Status: AC
  Administered 2019-02-14: 60 mg via SUBCUTANEOUS

## 2019-02-14 NOTE — Progress Notes (Signed)
Per orders of Tor Netters, NP (for Webb Silversmith, NP), injection of densumab (Prolia) 60 mg/mL given by Sharolyn Weber. Patient tolerated injection well.

## 2019-02-28 ENCOUNTER — Other Ambulatory Visit: Payer: Self-pay | Admitting: Internal Medicine

## 2019-07-27 ENCOUNTER — Telehealth: Payer: Self-pay

## 2019-07-27 DIAGNOSIS — M810 Age-related osteoporosis without current pathological fracture: Secondary | ICD-10-CM

## 2019-07-27 NOTE — Telephone Encounter (Signed)
LVM

## 2019-07-27 NOTE — Telephone Encounter (Signed)
Last Prolia inj 02-14-19.  Pt has Aetna MCR.  Pt would owe $20 copay only.  PA on file.  [id number], 2 inj, valid 07-25-19 to 07-24-20.

## 2019-07-30 NOTE — Addendum Note (Signed)
Addended by: Sherrie George on: 07/30/2019 11:51 AM   Modules accepted: Orders

## 2019-07-30 NOTE — Telephone Encounter (Signed)
Pt is scheduled for lab on 6/25 and inj on 7/8. Pt verbalized understanding.   Placed order for BMP

## 2019-08-10 ENCOUNTER — Other Ambulatory Visit (INDEPENDENT_AMBULATORY_CARE_PROVIDER_SITE_OTHER): Payer: Medicare HMO

## 2019-08-10 DIAGNOSIS — Z5181 Encounter for therapeutic drug level monitoring: Secondary | ICD-10-CM | POA: Diagnosis not present

## 2019-08-10 DIAGNOSIS — M810 Age-related osteoporosis without current pathological fracture: Secondary | ICD-10-CM

## 2019-08-10 LAB — BASIC METABOLIC PANEL
BUN: 17 mg/dL (ref 6–23)
CO2: 29 mEq/L (ref 19–32)
Calcium: 10.2 mg/dL (ref 8.4–10.5)
Chloride: 104 mEq/L (ref 96–112)
Creatinine, Ser: 0.81 mg/dL (ref 0.40–1.20)
GFR: 70.91 mL/min (ref >60.00)
Glucose, Bld: 96 mg/dL (ref 70–99)
Potassium: 4.9 mEq/L (ref 3.5–5.1)
Sodium: 140 mEq/L (ref 135–145)

## 2019-08-10 LAB — CALCIUM: Calcium: 10.2 mg/dL (ref 8.4–10.5)

## 2019-08-13 NOTE — Telephone Encounter (Signed)
Ca level normal and CrCl is normal at 70.94. Pt can proceed with inj on 7/8

## 2019-08-23 ENCOUNTER — Ambulatory Visit (INDEPENDENT_AMBULATORY_CARE_PROVIDER_SITE_OTHER): Payer: Medicare HMO

## 2019-08-23 ENCOUNTER — Other Ambulatory Visit: Payer: Self-pay

## 2019-08-23 DIAGNOSIS — M810 Age-related osteoporosis without current pathological fracture: Secondary | ICD-10-CM

## 2019-08-23 MED ORDER — DENOSUMAB 60 MG/ML ~~LOC~~ SOSY
60.0000 mg | PREFILLED_SYRINGE | Freq: Once | SUBCUTANEOUS | Status: AC
  Administered 2019-08-23: 60 mg via SUBCUTANEOUS

## 2019-08-23 NOTE — Progress Notes (Signed)
Pt given Prolia injection in left arm. Tolerated well. Gave pt reminder sticker for calendar for her next injection.

## 2019-10-16 ENCOUNTER — Other Ambulatory Visit: Payer: Self-pay | Admitting: Internal Medicine

## 2020-01-14 ENCOUNTER — Ambulatory Visit (INDEPENDENT_AMBULATORY_CARE_PROVIDER_SITE_OTHER): Payer: Medicare HMO | Admitting: Internal Medicine

## 2020-01-14 ENCOUNTER — Other Ambulatory Visit: Payer: Self-pay

## 2020-01-14 ENCOUNTER — Encounter: Payer: Self-pay | Admitting: Internal Medicine

## 2020-01-14 VITALS — BP 122/76 | HR 62 | Temp 97.2°F | Ht 66.75 in | Wt 145.0 lb

## 2020-01-14 DIAGNOSIS — M159 Polyosteoarthritis, unspecified: Secondary | ICD-10-CM

## 2020-01-14 DIAGNOSIS — N390 Urinary tract infection, site not specified: Secondary | ICD-10-CM

## 2020-01-14 DIAGNOSIS — Z23 Encounter for immunization: Secondary | ICD-10-CM

## 2020-01-14 DIAGNOSIS — M8949 Other hypertrophic osteoarthropathy, multiple sites: Secondary | ICD-10-CM

## 2020-01-14 DIAGNOSIS — M810 Age-related osteoporosis without current pathological fracture: Secondary | ICD-10-CM

## 2020-01-14 DIAGNOSIS — E039 Hypothyroidism, unspecified: Secondary | ICD-10-CM

## 2020-01-14 DIAGNOSIS — Z0000 Encounter for general adult medical examination without abnormal findings: Secondary | ICD-10-CM | POA: Diagnosis not present

## 2020-01-14 DIAGNOSIS — R590 Localized enlarged lymph nodes: Secondary | ICD-10-CM

## 2020-01-14 LAB — LIPID PANEL
Cholesterol: 218 mg/dL — ABNORMAL HIGH (ref 0–200)
HDL: 115 mg/dL (ref >39.00)
LDL Cholesterol: 86 mg/dL (ref 0–99)
NonHDL: 102.82
Total CHOL/HDL Ratio: 2
Triglycerides: 86 mg/dL (ref 0.0–149.0)
VLDL: 17.2 mg/dL (ref 0.0–40.0)

## 2020-01-14 LAB — CBC
HCT: 41.7 % (ref 36.0–46.0)
Hemoglobin: 14 g/dL (ref 12.0–15.0)
MCHC: 33.6 g/dL (ref 30.0–36.0)
MCV: 100.7 fl — ABNORMAL HIGH (ref 78.0–100.0)
Platelets: 203 10*3/uL (ref 150.0–400.0)
RBC: 4.15 Mil/uL (ref 3.87–5.11)
RDW: 13.7 % (ref 11.5–15.5)
WBC: 7.1 10*3/uL (ref 4.0–10.5)

## 2020-01-14 LAB — T4, FREE: Free T4: 1.07 ng/dL (ref 0.60–1.60)

## 2020-01-14 LAB — COMPREHENSIVE METABOLIC PANEL
ALT: 16 U/L (ref 0–35)
AST: 19 U/L (ref 0–37)
Albumin: 4.5 g/dL (ref 3.5–5.2)
Alkaline Phosphatase: 38 U/L — ABNORMAL LOW (ref 39–117)
BUN: 13 mg/dL (ref 6–23)
CO2: 28 mEq/L (ref 19–32)
Calcium: 9.5 mg/dL (ref 8.4–10.5)
Chloride: 100 mEq/L (ref 96–112)
Creatinine, Ser: 0.74 mg/dL (ref 0.40–1.20)
GFR: 84.75 mL/min (ref >60.00)
Glucose, Bld: 108 mg/dL — ABNORMAL HIGH (ref 70–99)
Potassium: 5.4 mEq/L — ABNORMAL HIGH (ref 3.5–5.1)
Sodium: 136 mEq/L (ref 135–145)
Total Bilirubin: 0.5 mg/dL (ref 0.2–1.2)
Total Protein: 6.9 g/dL (ref 6.0–8.3)

## 2020-01-14 LAB — TSH: TSH: 1.8 u[IU]/mL (ref 0.35–4.50)

## 2020-01-14 LAB — VITAMIN D 25 HYDROXY (VIT D DEFICIENCY, FRACTURES): VITD: 32.57 ng/mL (ref 30.00–100.00)

## 2020-01-14 NOTE — Patient Instructions (Signed)

## 2020-01-14 NOTE — Assessment & Plan Note (Signed)
Vit D today Encouraged daily weight bearing exercise Continue Prolia injections She will have her GYN order her bone density

## 2020-01-14 NOTE — Assessment & Plan Note (Signed)
Estradiol and Cipro, managed by GYN

## 2020-01-14 NOTE — Assessment & Plan Note (Signed)
TSH and Free T4 today Will adjust Levothyroxine if needed based on labs 

## 2020-01-14 NOTE — Progress Notes (Signed)
HPI:  Pt presents to the clinic today for her Welcome to Medicare Exam. She is also due to follow up chronic conditions.  Hypothyroidism: She denies any issues on her current dose of Levothyroxine. She does not follow with endocrinology.  Osteoporosis: Managed with Prolia. Bone density from 2016 reviewed. She has not been exercising routinely.  Post Coital UTI: Managed with prophylactic and estradiol. She follows with GYN.  Past Medical History:  Diagnosis Date  . ALLERGIC RHINITIS 01/30/2007  . Allergy   . ANEMIA-NOS 01/30/2007  . CAROTID BRUIT, RIGHT 01/30/2007  . DIVERTICULUM, BLADDER 01/30/2007  . HEART MURMUR, HX OF 01/30/2007  . Unspecified hypothyroidism 02/25/2009  . UTI'S, HX OF 01/30/2007    Current Outpatient Medications  Medication Sig Dispense Refill  . Calcium Carbonate (CALCIUM 600 PO) Take by mouth.    . Cholecalciferol (VITAMIN D3) 125 MCG (5000 UT) CAPS Take by mouth.    . ciprofloxacin (CIPRO) 500 MG tablet Take 500 mg by mouth as needed (after sex).    Marland Kitchen denosumab (PROLIA) 60 MG/ML SOLN injection Inject 60 mg into the skin every 6 (six) months. Administer in upper arm, thigh, or abdomen    . Diclofenac Sodium 2 % SOLN Place 2 g onto the skin 2 (two) times daily. 112 g 3  . estradiol (VAGIFEM) 25 MCG vaginal tablet Place 25 mcg vaginally 2 (two) times a week.    . levothyroxine (SYNTHROID) 75 MCG tablet TAKE 1 TABLET BY MOUTH EVERY DAY BEFORE BREAKFAST 90 tablet 0  . loratadine (CLARITIN) 10 MG tablet Take 10 mg by mouth as needed.      . naproxen sodium (ANAPROX) 220 MG tablet Take 220 mg by mouth 2 (two) times daily with a meal.    . pseudoephedrine (SUDAFED) 30 MG tablet Take 30 mg by mouth as needed.       Current Facility-Administered Medications  Medication Dose Route Frequency Provider Last Rate Last Admin  . 0.9 %  sodium chloride infusion  500 mL Intravenous Continuous Nandigam, Eleonore Chiquito, MD        Allergies  Allergen Reactions  . Macrobid  [Nitrofurantoin Monohyd Macro] Hives    Family History  Problem Relation Age of Onset  . Hypertension Mother   . Thyroid disease Mother        hypothyroid  . Alcohol abuse Mother   . Stroke Mother   . Alcohol abuse Father   . Arthritis Brother   . Depression Brother   . Colon polyps Brother   . Diabetes Other   . Cancer Maternal Aunt        bone cancer  . Arthritis Maternal Aunt        RA  . Colon cancer Neg Hx     Social History   Socioeconomic History  . Marital status: Married    Spouse name: Not on file  . Number of children: 2  . Years of education: 29  . Highest education level: Not on file  Occupational History  . Occupation: Fish farm manager  Tobacco Use  . Smoking status: Never Smoker  . Smokeless tobacco: Never Used  Substance and Sexual Activity  . Alcohol use: Yes    Alcohol/week: 6.0 standard drinks    Types: 6 Standard drinks or equivalent per week    Comment: occasional  . Drug use: No  . Sexual activity: Yes    Partners: Male  Other Topics Concern  . Not on file  Social History Narrative   [place]  [employer]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 Married 1979. 1 daughter 1985-married RN; 1 son [id number] [place]-graduated, works [place]-married '14. 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 Marriage in good health. SO in good health   Social Determinants of Health   Financial Resource Strain:   . Difficulty of Paying Living Expenses: Not on file  Food Insecurity:   . Worried About Programme researcher, broadcasting/film/videounning Out of Food in the Last Year: Not on file  . Ran Out of Food in the Last Year: Not on file  Transportation Needs:   . Lack of Transportation (Medical): Not on file  . Lack of Transportation (Non-Medical): Not on file  Physical Activity:   . Days of Exercise per Week: Not on file  . Minutes of Exercise per Session: Not on file  Stress:   . Feeling of Stress : Not on file  Social Connections:   . Frequency of Communication with Friends and Family: Not on file  . Frequency of Social  Gatherings with Friends and Family: Not on file  . Attends Religious Services: Not on file  . Active Member of Clubs or Organizations: Not on file  . Attends BankerClub or Organization Meetings: Not on file  . Marital Status: Not on file  Intimate Partner Violence:   . Fear of Current or Ex-Partner: Not on file  . Emotionally Abused: Not on file  . Physically Abused: Not on file  . Sexually Abused: Not on file     Hospitiliaztions: None  Health Maintenance:    Flu: 12/2019  Tetanus: 12/2018  Covid: Moderna  Pneumovax: never  Prevnar: never  Zostavax: 11/2015  Mammogram: 01/2019  Pap Smear: 12/2018  Bone Density: 12/2014  Colon Screening: 10/2015  Eye Doctor: annually  Dental Exam: biannually   Providers:   PCP: Nicki Reaperegina Nicklos Gaxiola, NP  I have personally reviewed and have noted:  1. The patient's medical and social history 2. Their use of alcohol, tobacco or illicit drugs 3. Their current medications and supplements 4. The patient's functional ability including ADL's, fall risks, home safety risks and hearing or visual impairment. 5. Diet and physical activities 6. Evidence for depression or mood disorder  Subjective:   Review of Systems:   Constitutional: Denies fever, malaise, fatigue, headache or abrupt weight changes.  HEENT: Denies eye pain, eye redness, ear pain, ringing in the ears, wax buildup, runny nose, nasal congestion, bloody nose, or sore throat. Respiratory: Denies difficulty breathing, shortness of breath, cough or sputum production.   Cardiovascular: Denies chest pain, chest tightness, palpitations or swelling in the hands or feet.  Gastrointestinal: Denies abdominal pain, bloating, constipation, diarrhea or blood in the stool.  GU: Denies urgency, frequency, pain with urination, burning sensation, blood in urine, odor or discharge. Musculoskeletal: Pt reports intermittent left hip pain. Denies decrease in range of motion, difficulty with gait, muscle pain or  joint and swelling.  Skin: Pt reports lump on left side of neck. Denies redness, rashes, lesions or ulcercations.  Neurological: Denies dizziness, difficulty with memory, difficulty with speech or problems with balance and coordination.  Psych: Denies anxiety, depression, SI/HI.  No other specific complaints in a complete review of systems (except as listed in HPI above).  Objective:  PE:   BP 122/76   Pulse 62   Temp (!) 97.2 F (36.2 C) (Temporal)   Ht 5' 6.75" (1.695 m)   Wt 145 lb (65.8 kg)   SpO2 98%   BMI 22.88 kg/m   Wt Readings from Last 3 Encounters:  01/08/19 143 lb (64.9 kg)  04/04/18 146 lb (66.2 kg)  01/05/18 143 lb (64.9 kg)    General: Appears her stated age, well developed, well nourished in NAD. Skin: Warm, dry and intact. No rashes noted. HEENT: Head: normal shape and size; Eyes: sclera white, no icterus, conjunctiva pink, PERRLA and EOMs intact;  Neck: Neck supple, trachea midline. Left anterior cervical adenopathy noted, smooth, mobile. Cardiovascular: Normal rate and rhythm. S1,S2 noted.  No murmur, rubs or gallops noted. No JVD or BLE edema. No carotid bruits noted. Pulmonary/Chest: Normal effort and positive vesicular breath sounds. No respiratory distress. No wheezes, rales or ronchi noted.  Abdomen: Soft and nontender. Normal bowel sounds. No distention or masses noted. Liver, spleen and kidneys non palpable. Musculoskeletal: Normal abduction and adduction of the left hip. Normal internal and external rotation of the left hip. No pain with palpation over the lumbar spine or left hip. Strength 5/5 BUE/BLE. No signs of joint swelling.  Neurological: Alert and oriented. Cranial nerves II-XII grossly intact. Coordination normal.  Psychiatric: Mood and affect normal. Behavior is normal. Judgment and thought content normal.   EKG: Normal rate, normal rhythm. No acute findings.  BMET    Component Value Date/Time   NA 140 08/10/2019 0921   NA 143  12/15/2017 1110   K 4.9 08/10/2019 0921   CL 104 08/10/2019 0921   CO2 29 08/10/2019 0921   GLUCOSE 96 08/10/2019 0921   BUN 17 08/10/2019 0921   BUN 10 12/15/2017 1110   CREATININE 0.81 08/10/2019 0921   CREATININE 0.70 12/31/2016 1600   CALCIUM 10.2 08/10/2019 0921   CALCIUM 10.2 08/10/2019 0921   GFRNONAA 91 12/15/2017 1110   GFRAA 105 12/15/2017 1110    Lipid Panel     Component Value Date/Time   CHOL 200 01/08/2019 1140   CHOL 201 (H) 12/15/2017 1110   TRIG 63.0 01/08/2019 1140   HDL 113.00 01/08/2019 1140   HDL 104 12/15/2017 1110   CHOLHDL 2 01/08/2019 1140   VLDL 12.6 01/08/2019 1140   LDLCALC 74 01/08/2019 1140   LDLCALC 83 12/15/2017 1110   LDLCALC 60 12/31/2016 1600    CBC    Component Value Date/Time   WBC 6.2 01/08/2019 1140   RBC 4.18 01/08/2019 1140   HGB 13.8 01/08/2019 1140   HGB 14.4 12/15/2017 1110   HCT 42.5 01/08/2019 1140   HCT 43.6 12/15/2017 1110   PLT 224.0 01/08/2019 1140   PLT 255 12/15/2017 1110   MCV 101.5 (H) 01/08/2019 1140   MCV 96 12/15/2017 1110   MCH 31.8 12/15/2017 1110   MCH 33.2 (H) 12/31/2016 1600   MCHC 32.6 01/08/2019 1140   RDW 13.1 01/08/2019 1140   RDW 11.9 (L) 12/15/2017 1110   LYMPHSABS 1.7 12/15/2017 1110   MONOABS 0.4 10/05/2010 1154   EOSABS 0.2 12/15/2017 1110   BASOSABS 0.0 12/15/2017 1110    Hgb A1C No results found for: HGBA1C    Assessment and Plan:   Medicare Annual Wellness Visit:  Diet: She does eat meat. She consumes fruits and veggies daily. She tries to avoid fried foods. She drinks mostly water. Physical activity: Walking Depression/mood screen: Negative, PHQ 9 score of 0 Hearing: Intact to whispered voice Visual acuity: Grossly normal, performs annual eye exam  ADLs: Capable Fall risk: None Home safety: Good Cognitive evaluation: Intact to orientation, naming, recall and repetition EOL planning:Full code/ I agree  Preventative Medicine: Flu, tetanus, covid and zostovax UTD. Prevner  today. She will get pneumovax in 1 year. She will  consider shingrix vaccine. She will call to reschedule her mammogram (60 days from recent Covid shot), and is aware she needs to ask her GYN to order her bone density scan. Pap smear UTD. Colon screening UTD. Encouraged her to consume a balanced diet and exercise regimen. Advised her to see an eye doctor and dentist annually. Will check CBC, CMET, TSH, Free T4, Lipid and Vit D today.  Cervical Adenopathy:  CBC Likely reactive Will monitor for now Will consider ultrasound if symptoms persist or worsen  Next appointment: 1 year, Medicare Wellness Exam   Nicki Reaper, NP This visit occurred during the SARS-CoV-2 public health emergency.  Safety protocols were in place, including screening questions prior to the visit, additional usage of staff PPE, and extensive cleaning of exam room while observing appropriate contact time as indicated for disinfecting solutions.

## 2020-01-15 ENCOUNTER — Other Ambulatory Visit: Payer: Self-pay | Admitting: Internal Medicine

## 2020-01-16 ENCOUNTER — Encounter: Payer: Self-pay | Admitting: Internal Medicine

## 2020-02-19 ENCOUNTER — Encounter: Payer: Self-pay | Admitting: Internal Medicine

## 2020-03-27 ENCOUNTER — Encounter: Payer: Self-pay | Admitting: Internal Medicine

## 2020-04-09 ENCOUNTER — Telehealth: Payer: Self-pay

## 2020-04-09 DIAGNOSIS — M810 Age-related osteoporosis without current pathological fracture: Secondary | ICD-10-CM

## 2020-04-09 NOTE — Telephone Encounter (Signed)
Called and informed patient that she will have to pay $20 OOP for her Prolia injection. Patient verbalized understanding. Labs scheduled for 2/28 and NV scheduled for 3/8.

## 2020-04-14 ENCOUNTER — Other Ambulatory Visit: Payer: Self-pay

## 2020-04-14 ENCOUNTER — Other Ambulatory Visit (INDEPENDENT_AMBULATORY_CARE_PROVIDER_SITE_OTHER): Payer: Medicare HMO

## 2020-04-14 DIAGNOSIS — M810 Age-related osteoporosis without current pathological fracture: Secondary | ICD-10-CM | POA: Diagnosis not present

## 2020-04-14 LAB — BASIC METABOLIC PANEL
BUN: 16 mg/dL (ref 6–23)
CO2: 27 mEq/L (ref 19–32)
Calcium: 9.7 mg/dL (ref 8.4–10.5)
Chloride: 103 mEq/L (ref 96–112)
Creatinine, Ser: 0.79 mg/dL (ref 0.40–1.20)
GFR: 78.22 mL/min (ref >60.00)
Glucose, Bld: 98 mg/dL (ref 70–99)
Potassium: 4.6 mEq/L (ref 3.5–5.1)
Sodium: 139 mEq/L (ref 135–145)

## 2020-04-15 NOTE — Telephone Encounter (Signed)
Ca normal and CrCl is 73.2mL/min. Ok for prolia inj.   Pt called and wants change apt time or date if needed.   Contacted pt and RS apt for prolia to 930 on 3/8.

## 2020-04-22 ENCOUNTER — Other Ambulatory Visit: Payer: Self-pay

## 2020-04-22 ENCOUNTER — Ambulatory Visit: Payer: Medicare HMO

## 2020-04-22 ENCOUNTER — Ambulatory Visit (INDEPENDENT_AMBULATORY_CARE_PROVIDER_SITE_OTHER): Payer: Medicare HMO

## 2020-04-22 DIAGNOSIS — M810 Age-related osteoporosis without current pathological fracture: Secondary | ICD-10-CM | POA: Diagnosis not present

## 2020-04-22 MED ORDER — DENOSUMAB 60 MG/ML ~~LOC~~ SOSY
60.0000 mg | PREFILLED_SYRINGE | Freq: Once | SUBCUTANEOUS | Status: AC
  Administered 2020-04-22: 60 mg via SUBCUTANEOUS

## 2020-04-22 NOTE — Progress Notes (Signed)
Patient presented for 6-month Prolia injection SQ to left arm given by Avrom Robarts, CMA. Patient tolerated injection well. 

## 2020-09-15 ENCOUNTER — Telehealth: Payer: Self-pay

## 2020-09-15 DIAGNOSIS — M810 Age-related osteoporosis without current pathological fracture: Secondary | ICD-10-CM

## 2020-09-15 NOTE — Telephone Encounter (Signed)
Prolia VOB initiated via [email or web address]  Last OV: 01/13/21 Next OV: not scheduled Last Prolia inj: 04/22/20 Next Prolia inj DUE: 10/24/20

## 2020-09-16 NOTE — Telephone Encounter (Signed)
Prior authorization required. 

## 2020-09-25 NOTE — Telephone Encounter (Signed)
PA initiated via [email or web address]  [name] (Key: [id number])Need help? Call us at [phone] Status Sent to [employer] The plan will fax you a determination, typically within 1 to 5 business days.  How do I follow up? Drug Prolia 60MG /ML syringes Form Aetna Specialty Medicare Part B Prolia and Xgeva Injectable Precertification Request Form Precertification for Prolia and Xgeva [phone]

## 2020-10-01 NOTE — Telephone Encounter (Signed)
Inbound fax from [place] advising PA approved. Auth# [id number] NWUU From Date: 09/25/2020 To Date: 09/25/2021

## 2020-10-02 NOTE — Telephone Encounter (Signed)
Pt ready for scheduling on or after 10/24/20  Out-of-pocket cost due at time of visit: $20  Primary: Aetna Medicare Prolia co-insurance: 0% Admin fee co-insurance: $20  Secondary: n/a Prolia co-insurance:  Admin fee co-insurance:   Deductible: does not apply  Prior Auth: APPROVED PA# [id number] NWUU Valid: 09/25/20-09/25/21

## 2020-10-07 NOTE — Telephone Encounter (Signed)
Spoke with patient. OOP cost $20. Patient has not had TOC yet and I discussed with patient options of seen Matt or Tabitha here or following Nicki Reaper to the new office. Patient asked about other [place] locations also. She will think this over and asked for me to call her back next weekend on 8/30 or 8/31 to follow up.

## 2020-10-15 NOTE — Addendum Note (Signed)
Addended by: Consuella Lose on: 10/15/2020 04:19 PM   Modules accepted: Orders

## 2020-10-15 NOTE — Addendum Note (Signed)
Addended by: Consuella Lose on: 10/15/2020 04:28 PM   Modules accepted: Orders

## 2020-10-15 NOTE — Telephone Encounter (Signed)
Spoke with patient. Patient will stay with [facility] but would like to wait and complete TOC with Tabitha.  Lab appointment scheduled for 10/22/20 and Nurse Visit for Prolia injection 10/29/20.  Dr Alphonsus Sias would you be willing to cosign for her lab order and Prolia injection this time?

## 2020-10-16 NOTE — Addendum Note (Signed)
Addended by: Tillman Abide I on: 10/16/2020 01:01 PM   Modules accepted: Orders

## 2020-10-17 ENCOUNTER — Other Ambulatory Visit: Payer: Self-pay | Admitting: Internal Medicine

## 2020-10-17 NOTE — Telephone Encounter (Signed)
  Notes to clinic Not a patient in this practice.  

## 2020-10-21 NOTE — Telephone Encounter (Signed)
Last OV- 01/14/2020 Next OV- canceled ([id number], and 01/19/2021) Last Filled- 01/16/2020

## 2020-10-22 ENCOUNTER — Other Ambulatory Visit: Payer: Self-pay

## 2020-10-22 ENCOUNTER — Other Ambulatory Visit (INDEPENDENT_AMBULATORY_CARE_PROVIDER_SITE_OTHER): Payer: Medicare HMO

## 2020-10-22 DIAGNOSIS — M810 Age-related osteoporosis without current pathological fracture: Secondary | ICD-10-CM | POA: Diagnosis not present

## 2020-10-22 LAB — BASIC METABOLIC PANEL
BUN: 13 mg/dL (ref 6–23)
CO2: 29 mEq/L (ref 19–32)
Calcium: 9.7 mg/dL (ref 8.4–10.5)
Chloride: 105 mEq/L (ref 96–112)
Creatinine, Ser: 0.74 mg/dL (ref 0.40–1.20)
GFR: 84.29 mL/min (ref >60.00)
Glucose, Bld: 102 mg/dL — ABNORMAL HIGH (ref 70–99)
Potassium: 4.5 mEq/L (ref 3.5–5.1)
Sodium: 141 mEq/L (ref 135–145)

## 2020-10-23 NOTE — Telephone Encounter (Signed)
CrCl 7.68 mL/min.  Calcium was normal on 10/22/20 9.7 Ok to proceed with Prolia injection

## 2020-10-29 ENCOUNTER — Ambulatory Visit (INDEPENDENT_AMBULATORY_CARE_PROVIDER_SITE_OTHER): Payer: Medicare HMO

## 2020-10-29 ENCOUNTER — Other Ambulatory Visit: Payer: Self-pay

## 2020-10-29 DIAGNOSIS — M810 Age-related osteoporosis without current pathological fracture: Secondary | ICD-10-CM | POA: Diagnosis not present

## 2020-10-29 MED ORDER — DENOSUMAB 60 MG/ML ~~LOC~~ SOSY
60.0000 mg | PREFILLED_SYRINGE | Freq: Once | SUBCUTANEOUS | Status: AC
  Administered 2020-10-29: 60 mg via SUBCUTANEOUS

## 2020-10-29 NOTE — Progress Notes (Signed)
Per orders of Dr. Sharen Hones, in the absence of Dr. Alphonsus Sias, injection of Prolia, given by Erby Pian. Patient tolerated injection well.

## 2021-01-19 ENCOUNTER — Encounter: Payer: Medicare HMO | Admitting: Internal Medicine

## 2021-01-20 ENCOUNTER — Ambulatory Visit: Payer: Medicare HMO

## 2021-01-20 ENCOUNTER — Encounter: Payer: Self-pay | Admitting: Family

## 2021-01-20 ENCOUNTER — Ambulatory Visit (INDEPENDENT_AMBULATORY_CARE_PROVIDER_SITE_OTHER): Payer: Medicare HMO | Admitting: Family

## 2021-01-20 ENCOUNTER — Ambulatory Visit (INDEPENDENT_AMBULATORY_CARE_PROVIDER_SITE_OTHER): Payer: Medicare HMO

## 2021-01-20 ENCOUNTER — Other Ambulatory Visit: Payer: Self-pay

## 2021-01-20 VITALS — BP 134/70 | HR 87 | Temp 97.8°F | Ht 66.5 in | Wt 141.0 lb

## 2021-01-20 DIAGNOSIS — J302 Other seasonal allergic rhinitis: Secondary | ICD-10-CM | POA: Diagnosis not present

## 2021-01-20 DIAGNOSIS — E559 Vitamin D deficiency, unspecified: Secondary | ICD-10-CM | POA: Diagnosis not present

## 2021-01-20 DIAGNOSIS — Z0001 Encounter for general adult medical examination with abnormal findings: Secondary | ICD-10-CM | POA: Diagnosis not present

## 2021-01-20 DIAGNOSIS — G8929 Other chronic pain: Secondary | ICD-10-CM

## 2021-01-20 DIAGNOSIS — M810 Age-related osteoporosis without current pathological fracture: Secondary | ICD-10-CM

## 2021-01-20 DIAGNOSIS — M79672 Pain in left foot: Secondary | ICD-10-CM | POA: Diagnosis not present

## 2021-01-20 DIAGNOSIS — Z13220 Encounter for screening for lipoid disorders: Secondary | ICD-10-CM | POA: Diagnosis not present

## 2021-01-20 DIAGNOSIS — N390 Urinary tract infection, site not specified: Secondary | ICD-10-CM

## 2021-01-20 DIAGNOSIS — Z23 Encounter for immunization: Secondary | ICD-10-CM

## 2021-01-20 DIAGNOSIS — E039 Hypothyroidism, unspecified: Secondary | ICD-10-CM

## 2021-01-20 LAB — TSH: TSH: 1.74 u[IU]/mL (ref 0.35–5.50)

## 2021-01-20 LAB — CBC WITH DIFFERENTIAL/PLATELET
Basophils Absolute: 0 10*3/uL (ref 0.0–0.1)
Basophils Relative: 0.3 % (ref 0.0–3.0)
Eosinophils Absolute: 0.1 10*3/uL (ref 0.0–0.7)
Eosinophils Relative: 2.4 % (ref 0.0–5.0)
HCT: 41.9 % (ref 36.0–46.0)
Hemoglobin: 13.9 g/dL (ref 12.0–15.0)
Lymphocytes Relative: 25.3 % (ref 12.0–46.0)
Lymphs Abs: 1.4 10*3/uL (ref 0.7–4.0)
MCHC: 33.1 g/dL (ref 30.0–36.0)
MCV: 101.7 fl — ABNORMAL HIGH (ref 78.0–100.0)
Monocytes Absolute: 0.4 10*3/uL (ref 0.1–1.0)
Monocytes Relative: 6.7 % (ref 3.0–12.0)
Neutro Abs: 3.5 10*3/uL (ref 1.4–7.7)
Neutrophils Relative %: 65.3 % (ref 43.0–77.0)
Platelets: 205 10*3/uL (ref 150.0–400.0)
RBC: 4.12 Mil/uL (ref 3.87–5.11)
RDW: 13.5 % (ref 11.5–15.5)
WBC: 5.4 10*3/uL (ref 4.0–10.5)

## 2021-01-20 LAB — COMPREHENSIVE METABOLIC PANEL
ALT: 16 U/L (ref 0–35)
AST: 18 U/L (ref 0–37)
Albumin: 4.6 g/dL (ref 3.5–5.2)
Alkaline Phosphatase: 43 U/L (ref 39–117)
BUN: 12 mg/dL (ref 6–23)
CO2: 27 mEq/L (ref 19–32)
Calcium: 9.7 mg/dL (ref 8.4–10.5)
Chloride: 104 mEq/L (ref 96–112)
Creatinine, Ser: 0.83 mg/dL (ref 0.40–1.20)
GFR: 73.32 mL/min (ref >60.00)
Glucose, Bld: 157 mg/dL — ABNORMAL HIGH (ref 70–99)
Potassium: 4 mEq/L (ref 3.5–5.1)
Sodium: 139 mEq/L (ref 135–145)
Total Bilirubin: 0.7 mg/dL (ref 0.2–1.2)
Total Protein: 7.1 g/dL (ref 6.0–8.3)

## 2021-01-20 LAB — VITAMIN D 25 HYDROXY (VIT D DEFICIENCY, FRACTURES): VITD: 27.55 ng/mL — ABNORMAL LOW (ref 30.00–100.00)

## 2021-01-20 LAB — LIPID PANEL
Cholesterol: 210 mg/dL — ABNORMAL HIGH (ref 0–200)
HDL: 126.1 mg/dL (ref >39.00)
LDL Cholesterol: 59 mg/dL (ref 0–99)
NonHDL: 83.84
Total CHOL/HDL Ratio: 2
Triglycerides: 122 mg/dL (ref 0.0–149.0)
VLDL: 24.4 mg/dL (ref 0.0–40.0)

## 2021-01-20 MED ORDER — LEVOTHYROXINE SODIUM 75 MCG PO TABS: ORAL_TABLET | ORAL | 1 refills | Status: DC

## 2021-01-20 NOTE — Progress Notes (Addendum)
Established Patient Office Visit  Subjective:  Patient ID: Amber Soto, female    DOB: 04-Aug-1955  Age: 65 y.o. MRN: [id number]  CC:  Chief Complaint  Patient presents with   Transitions Of Care   Foot Pain    Left top of the foot off and on for about a year.     HPI Amber Soto is here today to establish care, complete an annual Cpe exam as well as with an acute finding.   Patient was previously seeing Nicki Reaper, NP.   Pt  does have one acute concern. Left sided anterior foot pain, finds it to be constant intermittently throughout the day. Pain is dull and aching when it occurs and goes across the anterior upper foot and along the right lateral side of the great toe. She denies rash and or redness. Not necessary worse with walking and or movement, can occur even at rest. Last night she felt this and it felt achy as well. She denies any tingling and or numbness. No known injury or trauma to foot. No stiffness in the am.   Does use ortho inserts that she buys herself. She does have narrow feet so this is often a problem.   Chronic problems addressed today:  Hypothyroid: stable on levothyroxine 75 mcg once daily.   Osteoporosis: bone density suspected to be last year 2021, we will request records from her obgyn [facility], GYN in [place], [place])  Postcoital UTI: not as often as she used to. Cipro as needed which is helpful.    Past Medical History:  Diagnosis Date   ALLERGIC RHINITIS 01/30/2007   Allergy    ANEMIA-NOS 01/30/2007   CAROTID BRUIT, RIGHT 01/30/2007   DIVERTICULUM, BLADDER 01/30/2007   HEART MURMUR, HX OF 01/30/2007   Unspecified hypothyroidism 02/25/2009   UTI'S, HX OF 01/30/2007    Past Surgical History:  Procedure Laterality Date   COLONOSCOPY     10 yrs ago- normal   none      Family History  Problem Relation Age of Onset   Hypertension Mother    Thyroid disease Mother        hypothyroid   Alcohol abuse Mother    Stroke Mother     Alcohol abuse Father        in remission prior to death   Arthritis Brother    Depression Brother    Colon polyps Brother    Cancer Maternal Aunt        bone cancer   Arthritis Maternal Aunt        RA   Diabetes Other    Colon cancer Neg Hx     Social History   Socioeconomic History   Marital status: Married    Spouse name: Not on file   Number of children: 2   Years of education: 16   Highest education level: Not on file  Occupational History   Occupation: Fish farm manager  Tobacco Use   Smoking status: Never   Smokeless tobacco: Never  Vaping Use   Vaping Use: Never used  Substance and Sexual Activity   Alcohol use: Yes    Alcohol/week: 7.0 standard drinks    Types: 7 Glasses of wine per week    Comment: 1 a night   Drug use: Yes    Frequency: 1.0 times per week    Types: Marijuana   Sexual activity: Yes    Partners: Male  Other Topics Concern   Not on file  Social  History Narrative   [place]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 Married 1979. 1 daughter 1985-married RN; 1 son 75 [employer]-graduated, works [employer]-married '14. 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 SO in good health   Social Determinants of Health   Financial Resource Strain: Not on file  Food Insecurity: Not on file  Transportation Needs: Not on file  Physical Activity: Not on file  Stress: Not on file  Social Connections: Not on file  Intimate Partner Violence: Not on file    Outpatient Medications Prior to Visit  Medication Sig Dispense Refill   Calcium Carbonate (CALCIUM 600 PO) Take by mouth.     Cholecalciferol (VITAMIN D3) 50 MCG (2000 UT) TABS Take 2,000 Int'l Units by mouth daily.     ciprofloxacin (CIPRO) 500 MG tablet Take 500 mg by mouth as needed (after sex).     denosumab (PROLIA) 60 MG/ML SOLN injection Inject 60 mg into the skin every 6 (six) months. Administer in upper arm, thigh, or abdomen     diclofenac Sodium (VOLTAREN) 1 % GEL Apply 1 application  topically 4 (four) times daily. Prn pain     estradiol (VAGIFEM) 25 MCG vaginal tablet Place 25 mcg vaginally 2 (two) times a week.     naproxen (NAPROSYN) 500 MG tablet Take 500 mg by mouth 2 (two) times daily with a meal. Prn pain     Cholecalciferol (VITAMIN D3) 125 MCG (5000 UT) CAPS Take by mouth.     Diclofenac Sodium 2 % SOLN Place 2 g onto the skin 2 (two) times daily. 112 g 3   levothyroxine (SYNTHROID) 75 MCG tablet TAKE 1 TABLET BY MOUTH EVERY DAY BEFORE BREAKFAST 90 tablet 2   naproxen sodium (ANAPROX) 220 MG tablet Take 220 mg by mouth 2 (two) times daily with a meal.     pseudoephedrine (SUDAFED) 30 MG tablet Take 30 mg by mouth as needed.       0.9 %  sodium chloride infusion      No facility-administered medications prior to visit.    Allergies  Allergen Reactions   Macrobid [Nitrofurantoin Monohyd Macro] Hives    ROS Review of Systems  Constitutional:  Negative for chills and fatigue.  HENT:  Trouble swallowing: dg foot.   Respiratory:  Negative for cough and shortness of breath.   Cardiovascular:  Negative for chest pain and leg swelling.  Gastrointestinal:  Negative for diarrhea and nausea.  Genitourinary:  Negative for difficulty urinating.  Musculoskeletal:  Positive for arthralgias. Back pain: left anterior foot pain across the top, wears ortho inserts to help, on and off over a year. Psychiatric/Behavioral:  Negative for agitation and sleep disturbance.   All other systems reviewed and are negative.     Objective:    Physical Exam Cardiovascular:     Rate and Rhythm: Normal rate.  Musculoskeletal:     Right foot: No swelling, deformity (increased bony structure anterior foot), bunion, foot drop, tenderness (mild tenderness anterior midline aspect of foot), bony tenderness or crepitus. Normal pulse.     Left foot: Deformity (increased 'bony prom' on anterior navicular bone), tenderness (mild tenderness navicular) and bony tenderness present. No swelling or  bunion. Normal pulse.       Legs:   no edema, cyanosis, or clubbing noted Gen: NAD, resting comfortably HEENT: TMs normal bilaterally. OP clear. No thyromegaly noted.  CV: RRR with no murmurs appreciated Pulm: NWOB, CTAB with no crackles, wheezes, or rhonchi GI: Normal bowel sounds present. Soft, Nontender, Nondistended  Skin: warm, dry Neuro: CN2-12 grossly intact. Strength 5/5 in upper and lower extremities.  Psych: Normal affect and thought content  BP 134/70   Pulse 87   Temp 97.8 F (36.6 C) (Temporal)   Ht 5' 6.5" (1.689 m)   Wt 141 lb (64 kg)   SpO2 98%   BMI 22.42 kg/m  Wt Readings from Last 3 Encounters:  01/20/21 141 lb (64 kg)  01/14/20 145 lb (65.8 kg)  01/08/19 143 lb (64.9 kg)     There are no preventive care reminders to display for this patient.   There are no preventive care reminders to display for this patient.  Lab Results  Component Value Date   TSH 1.80 01/14/2020   Lab Results  Component Value Date   WBC 7.1 01/14/2020   HGB 14.0 01/14/2020   HCT 41.7 01/14/2020   MCV 100.7 (H) 01/14/2020   PLT 203.0 01/14/2020   Lab Results  Component Value Date   NA 141 10/22/2020   K 4.5 10/22/2020   CO2 29 10/22/2020   GLUCOSE 102 (H) 10/22/2020   BUN 13 10/22/2020   CREATININE 0.74 10/22/2020   BILITOT 0.5 01/14/2020   ALKPHOS 38 (L) 01/14/2020   AST 19 01/14/2020   ALT 16 01/14/2020   PROT 6.9 01/14/2020   ALBUMIN 4.5 01/14/2020   CALCIUM 9.7 10/22/2020   GFR 84.29 10/22/2020   Lab Results  Component Value Date   CHOL 218 (H) 01/14/2020   Lab Results  Component Value Date   HDL 115.00 01/14/2020   Lab Results  Component Value Date   LDLCALC 86 01/14/2020   Lab Results  Component Value Date   TRIG 86.0 01/14/2020   Lab Results  Component Value Date   CHOLHDL 2 01/14/2020   No results found for: HGBA1C    Assessment & Plan:   Problem List Items Addressed This Visit       Respiratory   Allergic rhinitis    Stable,  continue otc medication as needed.        Endocrine   Hypothyroidism    Stable, continue current dose of Synthroid.  Prescription sent to pharmacy.      Relevant Medications   levothyroxine (SYNTHROID) 75 MCG tablet   Other Relevant Orders   TSH     Musculoskeletal and Integument   Osteoporosis    Continue with Prolia every 6 months.  Bone density scan results requested from OB/GYN center pending bone density will be due again next year 2023.  Continue with over-the-counter calcium and vitamin D for bone strength.      Relevant Medications   Cholecalciferol (VITAMIN D3) 50 MCG (2000 UT) TABS     Genitourinary   Postcoital UTI    Ciprofloxacin when needed, continue follow up with OB/GYN in relation to this.        Other   Encounter for general adult medical examination with abnormal findings - Primary    Pt advised of the following/ and or handout given.    Recommendations on keeping yourself healthy:  - Exercise at least 30-45 minutes a day, 3-4 days a week.  - Eat a low-fat diet with lots of fruits and vegetables, up to 7-9 servings per day.  - Seatbelts can save your life. Wear them always.  - Smoke detectors on every level of your home, check batteries every year.  - Eye Doctor - have an eye exam every 1-2 years  - Safe sex - if you may be  exposed to STDs, use a condom.  - Alcohol -  If you drink, do it moderately, less than 2 drinks per day.  - Health Care Power of Attorney. Choose someone to speak for you if you are not able.  - Depression is common in our stressful world.If you're feeling down or losing interest in things you normally enjoy, please come in for a visit.  - Violence - If anyone is threatening or hurting you, please call immediately.  Due to recent changes in healthcare laws, you may see results of your imaging and/or laboratory studies on MyChart before I have had a chance to review them.  I understand that in some cases there may be results that are  confusing or concerning to you. Please understand that not all results are received at the same time and often I may need to interpret multiple results in order to provide you with the best plan of care or course of treatment. Therefore, I ask that you please give me 2 business days to thoroughly review all your results before contacting my office for clarification. Should we see a critical lab result, you will be contacted sooner.   I will see you again in one year for your annual comprehensive exam unless otherwise stated and or with acute concerns.  It was a pleasure seeing you today! Please do not hesitate to reach out with any questions and or concerns.  Regards,   Danel Studzinski         Relevant Orders   Comprehensive metabolic panel   CBC with Differential/Platelet   Vitamin D deficiency    Continue over-the-counter supplementation.  Vitamin D ordered pending results.      Relevant Orders   VITAMIN D 25 Hydroxy (Vit-D Deficiency, Fractures)   Chronic foot pain, left    X-ray ordered for today, advised patient to wear looser fitting shoes this may help.  Continue with Ortho inserts.  X-ray results pending when results are received we will reach out to patient.  If no improvement in symptoms with these measures alone will refer to podiatry.      Relevant Medications   naproxen (NAPROSYN) 500 MG tablet   Other Relevant Orders   DG Foot Complete Left    Meds ordered this encounter  Medications   levothyroxine (SYNTHROID) 75 MCG tablet    Sig: Take one po qam    Dispense:  90 tablet    Refill:  1    Order Specific Question:   Supervising Provider    Answer:   BEDSOLE, AMY E [[id number]]    Follow-up: Return in about 6 months (around 07/21/2021) for regular follow up .    Mort Sawyers, FNP

## 2021-01-20 NOTE — Assessment & Plan Note (Signed)
Pt advised of the following/ and or handout given.    Recommendations on keeping yourself healthy:  - Exercise at least 30-45 minutes a day, 3-4 days a week.  - Eat a low-fat diet with lots of fruits and vegetables, up to 7-9 servings per day.  - Seatbelts can save your life. Wear them always.  - Smoke detectors on every level of your home, check batteries every year.  - Eye Doctor - have an eye exam every 1-2 years  - Safe sex - if you may be exposed to STDs, use a condom.  - Alcohol -  If you drink, do it moderately, less than 2 drinks per day.  - Health Care Power of Attorney. Choose someone to speak for you if you are not able.  - Depression is common in our stressful world.If you're feeling down or losing interest in things you normally enjoy, please come in for a visit.  - Violence - If anyone is threatening or hurting you, please call immediately.  Due to recent changes in healthcare laws, you may see results of your imaging and/or laboratory studies on MyChart before I have had a chance to review them.  I understand that in some cases there may be results that are confusing or concerning to you. Please understand that not all results are received at the same time and often I may need to interpret multiple results in order to provide you with the best plan of care or course of treatment. Therefore, I ask that you please give me 2 business days to thoroughly review all your results before contacting my office for clarification. Should we see a critical lab result, you will be contacted sooner.   I will see you again in one year for your annual comprehensive exam unless otherwise stated and or with acute concerns.  It was a pleasure seeing you today! Please do not hesitate to reach out with any questions and or concerns.  Regards,   Mort Sawyers

## 2021-01-20 NOTE — Assessment & Plan Note (Addendum)
Approximately 30 minutes in office were spent discussing with patient and reviewing labs and notes/ prior reports. Continue with Prolia every 6 months.  Bone density scan results requested from OB/GYN center pending bone density will be due again next year 2023.  Continue with over-the-counter calcium and vitamin D for bone strength.

## 2021-01-20 NOTE — Assessment & Plan Note (Addendum)
X-ray ordered for today, advised patient to wear looser fitting shoes this may help.  Advised tylenol/ibuprofen prn pain. Continue with Ortho inserts.  X-ray results pending when results are received we will reach out to patient.  If no improvement in symptoms with these measures alone will refer to podiatry.

## 2021-01-20 NOTE — Assessment & Plan Note (Addendum)
Ciprofloxacin when needed, continue follow up with OB/GYN in relation to this. Will request records.

## 2021-01-20 NOTE — Patient Instructions (Addendum)
Please contact your insurance to check coverage for the Shingrix vaccine to protect you against shingles. This is a 2 dose series.  One shot followed by a second shot 2-6 months later.  If this vaccine is covered by your insurance, please contact our office to schedule a nurse visit and we will be happy to give it to you.   Stop by the lab prior to leaving today. I will notify you of your results once received.   Complete xray(s) prior to leaving today. I will notify you of your results once received.  Recommendations on keeping yourself healthy:  - Exercise at least 30-45 minutes a day, 3-4 days a week.  - Eat a low-fat diet with lots of fruits and vegetables, up to 7-9 servings per day.  - Seatbelts can save your life. Wear them always.  - Smoke detectors on every level of your home, check batteries every year.  - Eye Doctor - have an eye exam every 1-2 years  - Safe sex - if you may be exposed to STDs, use a condom.  - Alcohol -  If you drink, do it moderately, less than 2 drinks per day.  - Health Care Power of Attorney. Choose someone to speak for you if you are not able.  - Depression is common in our stressful world.If you're feeling down or losing interest in things you normally enjoy, please come in for a visit.  - Violence - If anyone is threatening or hurting you, please call immediately.  Due to recent changes in healthcare laws, you may see results of your imaging and/or laboratory studies on MyChart before I have had a chance to review them.  I understand that in some cases there may be results that are confusing or concerning to you. Please understand that not all results are received at the same time and often I may need to interpret multiple results in order to provide you with the best plan of care or course of treatment. Therefore, I ask that you please give me 2 business days to thoroughly review all your results before contacting my office for clarification. Should we see a  critical lab result, you will be contacted sooner.   I will see you again in one year for your annual comprehensive exam unless otherwise stated and or with acute concerns.  It was a pleasure seeing you today! Please do not hesitate to reach out with any questions and or concerns.  Regards,   Mort Sawyers

## 2021-01-20 NOTE — Assessment & Plan Note (Signed)
Continue over-the-counter supplementation.  Vitamin D ordered pending results.

## 2021-01-20 NOTE — Assessment & Plan Note (Signed)
Stable, continue current dose of Synthroid.  Prescription sent to pharmacy.

## 2021-01-20 NOTE — Assessment & Plan Note (Signed)
Stable, continue otc medication as needed.

## 2021-01-21 ENCOUNTER — Other Ambulatory Visit: Payer: Self-pay | Admitting: Family

## 2021-01-21 ENCOUNTER — Other Ambulatory Visit (INDEPENDENT_AMBULATORY_CARE_PROVIDER_SITE_OTHER): Payer: Medicare HMO

## 2021-01-21 DIAGNOSIS — R739 Hyperglycemia, unspecified: Secondary | ICD-10-CM

## 2021-01-21 DIAGNOSIS — E559 Vitamin D deficiency, unspecified: Secondary | ICD-10-CM

## 2021-01-21 LAB — HEMOGLOBIN A1C: Hgb A1c MFr Bld: 5.7 % (ref 4.6–6.5)

## 2021-01-21 MED ORDER — VITAMIN D (ERGOCALCIFEROL) 1.25 MG (50000 UNIT) PO CAPS: 50000.0000 [IU] | ORAL_CAPSULE | ORAL | 0 refills | Status: AC

## 2021-01-21 NOTE — Addendum Note (Signed)
Addended by: Warden Fillers on: 01/21/2021 08:33 AM   Modules accepted: Orders

## 2021-01-23 NOTE — Addendum Note (Signed)
Addended by: Mort Sawyers on: 01/23/2021 11:46 AM   Modules accepted: Level of Service

## 2021-04-14 ENCOUNTER — Telehealth: Payer: Self-pay

## 2021-04-14 DIAGNOSIS — M810 Age-related osteoporosis without current pathological fracture: Secondary | ICD-10-CM

## 2021-04-14 DIAGNOSIS — E559 Vitamin D deficiency, unspecified: Secondary | ICD-10-CM

## 2021-04-14 NOTE — Telephone Encounter (Signed)
Benefits submitted and received. Next injection due 04/29/21 or after PA good through 09/25/21

## 2021-04-24 NOTE — Addendum Note (Signed)
Addended by: Consuella Lose on: 04/24/2021 04:09 PM ? ? Modules accepted: Orders ? ?

## 2021-04-24 NOTE — Telephone Encounter (Signed)
OOP cost is $20. ?Patient advised. ?Lab on 04/28/21 and NV 04/30/21 ? ?Spoke with Mort Sawyers, needs Vitamin D re checked also. Orders put in. ?

## 2021-04-28 ENCOUNTER — Other Ambulatory Visit (INDEPENDENT_AMBULATORY_CARE_PROVIDER_SITE_OTHER): Payer: Medicare HMO

## 2021-04-28 ENCOUNTER — Other Ambulatory Visit: Payer: Self-pay

## 2021-04-28 DIAGNOSIS — E559 Vitamin D deficiency, unspecified: Secondary | ICD-10-CM

## 2021-04-28 DIAGNOSIS — M810 Age-related osteoporosis without current pathological fracture: Secondary | ICD-10-CM

## 2021-04-28 LAB — BASIC METABOLIC PANEL
BUN: 14 mg/dL (ref 6–23)
CO2: 28 mEq/L (ref 19–32)
Calcium: 9.3 mg/dL (ref 8.4–10.5)
Chloride: 103 mEq/L (ref 96–112)
Creatinine, Ser: 0.75 mg/dL (ref 0.40–1.20)
GFR: 82.65 mL/min (ref >60.00)
Glucose, Bld: 95 mg/dL (ref 70–99)
Potassium: 3.9 mEq/L (ref 3.5–5.1)
Sodium: 140 mEq/L (ref 135–145)

## 2021-04-28 LAB — VITAMIN D 25 HYDROXY (VIT D DEFICIENCY, FRACTURES): VITD: 41.13 ng/mL (ref 30.00–100.00)

## 2021-04-28 NOTE — Telephone Encounter (Signed)
CrCl is 40.67 mL/min ?Calcium was normal at 9.3 ?Vitamin D level better. ? ?

## 2021-04-30 ENCOUNTER — Ambulatory Visit (INDEPENDENT_AMBULATORY_CARE_PROVIDER_SITE_OTHER): Payer: Medicare HMO

## 2021-04-30 ENCOUNTER — Other Ambulatory Visit: Payer: Self-pay

## 2021-04-30 DIAGNOSIS — M810 Age-related osteoporosis without current pathological fracture: Secondary | ICD-10-CM | POA: Diagnosis not present

## 2021-04-30 MED ORDER — DENOSUMAB 60 MG/ML ~~LOC~~ SOSY
60.0000 mg | PREFILLED_SYRINGE | Freq: Once | SUBCUTANEOUS | Status: AC
  Administered 2021-04-30: 60 mg via SUBCUTANEOUS

## 2021-04-30 NOTE — Progress Notes (Addendum)
Per orders of [employer], injection of Prolia given by Loreen Freud. ?Patient tolerated injection well.  ?

## 2021-07-19 ENCOUNTER — Other Ambulatory Visit: Payer: Self-pay | Admitting: Family

## 2021-07-19 DIAGNOSIS — E039 Hypothyroidism, unspecified: Secondary | ICD-10-CM

## 2021-07-21 ENCOUNTER — Ambulatory Visit (INDEPENDENT_AMBULATORY_CARE_PROVIDER_SITE_OTHER)
Admission: RE | Admit: 2021-07-21 | Discharge: 2021-07-21 | Disposition: A | Discharge status: Home / Self Care | Payer: Medicare HMO | Source: Ambulatory Visit | Attending: Family | Admitting: Family

## 2021-07-21 ENCOUNTER — Encounter: Payer: Self-pay | Admitting: Family

## 2021-07-21 ENCOUNTER — Ambulatory Visit (INDEPENDENT_AMBULATORY_CARE_PROVIDER_SITE_OTHER): Payer: Medicare HMO | Admitting: Family

## 2021-07-21 VITALS — BP 122/68 | HR 62 | Temp 98.8°F | Resp 16 | Ht 66.5 in | Wt 143.2 lb

## 2021-07-21 DIAGNOSIS — M79641 Pain in right hand: Secondary | ICD-10-CM | POA: Diagnosis not present

## 2021-07-21 DIAGNOSIS — E039 Hypothyroidism, unspecified: Secondary | ICD-10-CM

## 2021-07-21 DIAGNOSIS — I499 Cardiac arrhythmia, unspecified: Secondary | ICD-10-CM | POA: Diagnosis not present

## 2021-07-21 DIAGNOSIS — S6991XA Unspecified injury of right wrist, hand and finger(s), initial encounter: Secondary | ICD-10-CM | POA: Diagnosis not present

## 2021-07-21 DIAGNOSIS — E559 Vitamin D deficiency, unspecified: Secondary | ICD-10-CM

## 2021-07-21 DIAGNOSIS — M810 Age-related osteoporosis without current pathological fracture: Secondary | ICD-10-CM

## 2021-07-21 DIAGNOSIS — R5383 Other fatigue: Secondary | ICD-10-CM | POA: Diagnosis not present

## 2021-07-21 DIAGNOSIS — M2550 Pain in unspecified joint: Secondary | ICD-10-CM

## 2021-07-21 DIAGNOSIS — R9431 Abnormal electrocardiogram [ECG] [EKG]: Secondary | ICD-10-CM

## 2021-07-21 LAB — CBC
HCT: 41.2 % (ref 36.0–46.0)
Hemoglobin: 13.6 g/dL (ref 12.0–15.0)
MCHC: 33 g/dL (ref 30.0–36.0)
MCV: 101.8 fl — ABNORMAL HIGH (ref 78.0–100.0)
Platelets: 195 10*3/uL (ref 150.0–400.0)
RBC: 4.05 Mil/uL (ref 3.87–5.11)
RDW: 13 % (ref 11.5–15.5)
WBC: 5.9 10*3/uL (ref 4.0–10.5)

## 2021-07-21 LAB — BASIC METABOLIC PANEL
BUN: 16 mg/dL (ref 6–23)
CO2: 27 mEq/L (ref 19–32)
Calcium: 9.2 mg/dL (ref 8.4–10.5)
Chloride: 105 mEq/L (ref 96–112)
Creatinine, Ser: 0.67 mg/dL (ref 0.40–1.20)
GFR: 90.59 mL/min (ref >60.00)
Glucose, Bld: 100 mg/dL — ABNORMAL HIGH (ref 70–99)
Potassium: 3.9 mEq/L (ref 3.5–5.1)
Sodium: 140 mEq/L (ref 135–145)

## 2021-07-21 LAB — B12 AND FOLATE PANEL
Folate: 18.3 ng/mL (ref >5.9)
Vitamin B-12: 154 pg/mL — ABNORMAL LOW (ref 211–911)

## 2021-07-21 LAB — TSH: TSH: 1.21 u[IU]/mL (ref 0.35–5.50)

## 2021-07-21 NOTE — Assessment & Plan Note (Signed)
Ordering tsh today pending results Continue levothyroxine 75 mcg once daily, do not take with food and or other medication by at least thirty minutes.

## 2021-07-21 NOTE — Assessment & Plan Note (Signed)
ordering ana and rf  Last ordered back in 2015, negative.

## 2021-07-21 NOTE — Assessment & Plan Note (Signed)
Xray right hand Also suspected tenosynovitis Handout given to pt as well  nsaids prn, ice prn

## 2021-07-21 NOTE — Assessment & Plan Note (Signed)
Advised pt to reach out to gyn as we have not received history of when dexa was. Will await records and schedule accordingly. Continue prolia injections q35m

## 2021-07-21 NOTE — Patient Instructions (Addendum)
Please call gyn to see if they can give you the date of your last bone density exam.   A referral was placed today for cardiology.  Please let us know if you have not heard back within 2 weeks about the referral.  Complete xray(s) prior to leaving today. I will notify you of your results once received.  Stop by the lab prior to leaving today. I will notify you of your results once received.   Due to recent changes in healthcare laws, you may see results of your imaging and/or laboratory studies on MyChart before I have had a chance to review them.  I understand that in some cases there may be results that are confusing or concerning to you. Please understand that not all results are received at the same time and often I may need to interpret multiple results in order to provide you with the best plan of care or course of treatment. Therefore, I ask that you please give me 2 business days to thoroughly review all your results before contacting my office for clarification. Should we see a critical lab result, you will be contacted sooner.   It was a pleasure seeing you today! Please do not hesitate to reach out with any questions and or concerns.  Regards,   Mort Sawyers FNP-C

## 2021-07-21 NOTE — Progress Notes (Signed)
Established Patient Office Visit  Subjective:  Patient ID: Amber Soto, female    DOB: 01-22-1956  Age: 66 y.o. MRN: [id number]  CC:  Chief Complaint  Patient presents with   Hand Pain    Jammed knuckle on right hand.    HPI Amber Soto is here today for follow up.   Pt is with acute concerns.  March 1st jammed her knuckle and didn't think much of it when it occurred, however not fully healing. Never bruised but still painful ROM and tender when pressing at base of right metacarpal right hand did apply ice in beginning but no relief. Does take tylenol prn and or aleve but more for arthritic symptoms.   Hypothyroid: has felt run down on and off more than normal. No weight changes.no thinning hair. Does have dry skin here and there. Taking levothyroxine 75 mcg daily. Does take seperately from other medications and or food.   Osteoporosis: prolia every six months. Tolerating well.   Increased fatigue, not quite feeling herself. Doesn't think that she is depressed, has been thinking about this but doesn't feel this is it.   Does feel some heart flutters from time to time.  No chest pain or shortness of breath.  Drinks two cups of coffee in the am.      07/21/2021    9:49 AM 01/20/2021   10:38 AM 01/14/2020    9:30 AM  PHQ9 SCORE ONLY  PHQ-9 Total Score 4 0 0     Past Medical History:  Diagnosis Date   ALLERGIC RHINITIS 01/30/2007   Allergy    ANEMIA-NOS 01/30/2007   CAROTID BRUIT, RIGHT 01/30/2007   DIVERTICULUM, BLADDER 01/30/2007   HEART MURMUR, HX OF 01/30/2007   Unspecified hypothyroidism 02/25/2009   UTI'S, HX OF 01/30/2007    Past Surgical History:  Procedure Laterality Date   COLONOSCOPY     10 yrs ago- normal   none      Family History  Problem Relation Age of Onset   Hypertension Mother    Thyroid disease Mother        hypothyroid   Alcohol abuse Mother    Stroke Mother    Alcohol abuse Father        in remission prior to death   Arthritis  Brother    Depression Brother    Colon polyps Brother    Cancer Maternal Aunt        bone cancer   Arthritis Maternal Aunt        RA   Diabetes Other    Colon cancer Neg Hx     Social History   Socioeconomic History   Marital status: Married    Spouse name: Not on file   Number of children: 2   Years of education: 16   Highest education level: Not on file  Occupational History   Occupation: Investment banker, corporate  Tobacco Use   Smoking status: Never   Smokeless tobacco: Never  Vaping Use   Vaping Use: Never used  Substance and Sexual Activity   Alcohol use: Yes    Alcohol/week: 7.0 standard drinks    Types: 7 Glasses of wine per week    Comment: 1 a night   Drug use: Yes    Frequency: 1.0 times per week    Types: Marijuana   Sexual activity: Yes    Partners: Male  Other Topics Concern   Not on file  Social History Narrative   [place]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 Married 1979.  1 daughter 1985-married RN; 1 son 62 [place]-graduated, works [employer]-married '14. Work: Transport planner at [employer]. Marriage in good health. SO in good health   Social Determinants of Health   Financial Resource Strain: Not on file  Food Insecurity: Not on file  Transportation Needs: Not on file  Physical Activity: Not on file  Stress: Not on file  Social Connections: Not on file  Intimate Partner Violence: Not on file    Outpatient Medications Prior to Visit  Medication Sig Dispense Refill   Calcium Carbonate (CALCIUM 600 PO) Take by mouth.     Cholecalciferol (VITAMIN D3) 50 MCG (2000 UT) TABS Take 2,000 Int'l Units by mouth daily.     ciprofloxacin (CIPRO) 500 MG tablet Take 500 mg by mouth as needed (after sex).     denosumab (PROLIA) 60 MG/ML SOLN injection Inject 60 mg into the skin every 6 (six) months. Administer in upper arm, thigh, or abdomen     diclofenac Sodium (VOLTAREN) 1 % GEL Apply 1 application topically 4 (four) times daily. Prn pain     estradiol (VAGIFEM) 25 MCG  vaginal tablet Place 25 mcg vaginally 2 (two) times a week.     levothyroxine (SYNTHROID) 75 MCG tablet TAKE 1 TABLET BY MOUTH EVERY DAY IN THE MORNING 90 tablet 1   naproxen (NAPROSYN) 500 MG tablet Take 500 mg by mouth 2 (two) times daily with a meal. Prn pain     No facility-administered medications prior to visit.    Allergies  Allergen Reactions   Macrobid [Nitrofurantoin Monohyd Macro] Hives         Objective:    Physical Exam Vitals reviewed.  Constitutional:      General: She is not in acute distress.    Appearance: Normal appearance. She is normal weight. She is not ill-appearing, toxic-appearing or diaphoretic.  HENT:     Mouth/Throat:     Pharynx: No pharyngeal swelling.     Tonsils: No tonsillar exudate.  Neck:     Thyroid: No thyroid mass.  Cardiovascular:     Rate and Rhythm: Normal rate and regular rhythm.     Heart sounds: Murmur heard.  Pulmonary:     Effort: Pulmonary effort is normal.     Breath sounds: Normal breath sounds.  Abdominal:     General: Abdomen is flat. Bowel sounds are normal.     Palpations: Abdomen is soft.  Musculoskeletal:        General: Normal range of motion.     Right hand: Swelling (base of right anterior proximal phalynx base of fourth metacarpal) and tenderness (with thickening of skin at base of right 4th metacarpal) present. Normal strength (pain with rom , gripping).  Lymphadenopathy:     Cervical:     Right cervical: No superficial cervical adenopathy.    Left cervical: No superficial cervical adenopathy.  Skin:    General: Skin is warm.     Capillary Refill: Capillary refill takes less than 2 seconds.  Neurological:     General: No focal deficit present.     Mental Status: She is alert and oriented to person, place, and time.  Psychiatric:        Mood and Affect: Mood normal.        Behavior: Behavior normal.        Thought Content: Thought content normal.        Judgment: Judgment normal.      BP 122/68    Pulse 62  Temp 98.8 F (37.1 C)   Resp 16   Ht 5' 6.5" (1.689 m)   Wt 143 lb 4 oz (65 kg)   SpO2 99%   BMI 22.77 kg/m  Wt Readings from Last 3 Encounters:  07/21/21 143 lb 4 oz (65 kg)  01/20/21 141 lb (64 kg)  01/14/20 145 lb (65.8 kg)     Health Maintenance Due  Topic Date Due   Zoster Vaccines- Shingrix (1 of 2) Never done   COVID-19 Vaccine (4 - Booster for Moderna series) 02/12/2020   MAMMOGRAM  01/30/2021    There are no preventive care reminders to display for this patient.  Lab Results  Component Value Date   TSH 1.74 01/20/2021   Lab Results  Component Value Date   WBC 5.4 01/20/2021   HGB 13.9 01/20/2021   HCT 41.9 01/20/2021   MCV 101.7 (H) 01/20/2021   PLT 205.0 01/20/2021   Lab Results  Component Value Date   NA 140 04/28/2021   K 3.9 04/28/2021   CO2 28 04/28/2021   GLUCOSE 95 04/28/2021   BUN 14 04/28/2021   CREATININE 0.75 04/28/2021   BILITOT 0.7 01/20/2021   ALKPHOS 43 01/20/2021   AST 18 01/20/2021   ALT 16 01/20/2021   PROT 7.1 01/20/2021   ALBUMIN 4.6 01/20/2021   CALCIUM 9.3 04/28/2021   GFR 82.65 04/28/2021   Lab Results  Component Value Date   CHOL 210 (H) 01/20/2021   Lab Results  Component Value Date   HDL 126.10 01/20/2021   Lab Results  Component Value Date   LDLCALC 59 01/20/2021   Lab Results  Component Value Date   TRIG 122.0 01/20/2021   Lab Results  Component Value Date   CHOLHDL 2 01/20/2021   Lab Results  Component Value Date   HGBA1C 5.7 01/21/2021      Assessment & Plan:   Problem List Items Addressed This Visit       Endocrine   Hypothyroidism    Ordering tsh today pending results Continue levothyroxine 75 mcg once daily, do not take with food and or other medication by at least thirty minutes.          Musculoskeletal and Integument   Osteoporosis    Advised pt to reach out to gyn as we have not received history of when dexa was. Will await records and schedule accordingly. Continue  prolia injections q25m         Other   Vitamin D deficiency    continue daily supplement otc       Irregular cardiac rhythm    ekg today in office       Relevant Orders   EKG 12-Lead (Completed)   Basic metabolic panel   Ambulatory referral to Cardiology   Right hand pain    Xray right hand Also suspected tenosynovitis Handout given to pt as well  nsaids prn, ice prn        Relevant Orders   DG Hand Complete Right   ANA   Rheumatoid factor   Injury of finger of right hand    Xray ordered pending results       Relevant Orders   DG Hand Complete Right   Other fatigue - Primary    Ordering cbc tsh b12 folate pending results       Relevant Orders   TSH   B12 and Folate Panel   CBC   EKG 12-Lead (Completed)   Basic metabolic panel  ANA   Rheumatoid factor   Abnormal EKG    Very slight possible left atrial enlargement noted in p wave v1.  Did hear irregular rhythm intermittently that was not picked up on ekg. Watch caffeine and chocolate intake.  If any chest pain, sudden onset sob go to er and or call 911.  referral to cardiologist for further workup, possible MR          Relevant Orders   Ambulatory referral to Cardiology   Polyarthralgia    ordering ana and rf  Last ordered back in 2015, negative.        Relevant Orders   ANA   Rheumatoid factor    No orders of the defined types were placed in this encounter.   Follow-up: Return in about 6 months (around 01/20/2022) for regular follow up appointment .    Eugenia Pancoast, FNP

## 2021-07-21 NOTE — Assessment & Plan Note (Signed)
Xray ordered pending results

## 2021-07-21 NOTE — Progress Notes (Signed)
Vitamin B12 very low, please set up for B12 injections, 1000 mcg IM once monthly for three months, also schedule 3 month f/u appt to repeat labs and f/u in office. Recommend also oral otc 1000 mcg once daily vitamin B12  Other labs are ok.

## 2021-07-21 NOTE — Assessment & Plan Note (Signed)
Very slight possible left atrial enlargement noted in p wave v1.  Did hear irregular rhythm intermittently that was not picked up on ekg. Watch caffeine and chocolate intake.  If any chest pain, sudden onset sob go to er and or call 911.  referral to cardiologist for further workup, possible MR

## 2021-07-21 NOTE — Assessment & Plan Note (Signed)
Ordering cbc tsh b12 folate pending results

## 2021-07-21 NOTE — Assessment & Plan Note (Signed)
continue daily supplement otc

## 2021-07-21 NOTE — Assessment & Plan Note (Signed)
ekg today in office  

## 2021-07-22 ENCOUNTER — Encounter: Payer: Self-pay | Admitting: Family

## 2021-07-22 LAB — ANA: Anti Nuclear Antibody (ANA): NEGATIVE

## 2021-07-30 ENCOUNTER — Ambulatory Visit (INDEPENDENT_AMBULATORY_CARE_PROVIDER_SITE_OTHER): Payer: Medicare HMO

## 2021-07-30 DIAGNOSIS — E538 Deficiency of other specified B group vitamins: Secondary | ICD-10-CM

## 2021-07-30 MED ORDER — CYANOCOBALAMIN 1000 MCG/ML IJ SOLN
1000.0000 ug | Freq: Once | INTRAMUSCULAR | Status: AC
  Administered 2021-07-30: 1000 ug via INTRAMUSCULAR

## 2021-07-30 NOTE — Progress Notes (Signed)
Per orders of Mordecai Maes NP, injection of B-12 given by Vertis Kelch in left deltoid. Patient tolerated injection well.

## 2021-08-04 ENCOUNTER — Encounter: Payer: Self-pay | Admitting: Family

## 2021-08-04 DIAGNOSIS — E538 Deficiency of other specified B group vitamins: Secondary | ICD-10-CM

## 2021-08-04 MED ORDER — CYANOCOBALAMIN 1000 MCG/ML IJ SOLN: 1000.0000 ug | INTRAMUSCULAR | 2 refills | Status: DC

## 2021-08-24 ENCOUNTER — Ambulatory Visit: Payer: Medicare HMO | Admitting: Cardiology

## 2021-08-24 ENCOUNTER — Ambulatory Visit (INDEPENDENT_AMBULATORY_CARE_PROVIDER_SITE_OTHER): Payer: Medicare HMO

## 2021-08-24 ENCOUNTER — Encounter: Payer: Self-pay | Admitting: Cardiology

## 2021-08-24 DIAGNOSIS — E039 Hypothyroidism, unspecified: Secondary | ICD-10-CM

## 2021-08-24 DIAGNOSIS — R002 Palpitations: Secondary | ICD-10-CM | POA: Insufficient documentation

## 2021-08-24 DIAGNOSIS — R011 Cardiac murmur, unspecified: Secondary | ICD-10-CM

## 2021-08-24 NOTE — Assessment & Plan Note (Signed)
We will go ahead and check an echocardiogram to ensure proper structure and function of her heart.  Murmur appreciated systolic at right sternal border

## 2021-08-24 NOTE — Progress Notes (Unsigned)
ZIO XT serial # [id number] from office inventory applied to patient.

## 2021-08-24 NOTE — Patient Instructions (Signed)
Medication Instructions:  Your physician recommends that you continue on your current medications as directed. Please refer to the Current Medication list given to you today. ' *If you need a refill on your cardiac medications before your next appointment, please call your pharmacy*     Testing/Procedures: Your physician has requested that you have an echocardiogram. Echocardiography is a painless test that uses sound waves to create images of your heart. It provides your doctor with information about the size and shape of your heart and how well your heart's chambers and valves are working. This procedure takes approximately one hour. There are no restrictions for this procedure.   Your physician has recommended that you wear an event monitor. Event monitors are medical devices that record the heart's electrical activity. Doctors most often [place] these monitors to diagnose arrhythmias. Arrhythmias are problems with the speed or rhythm of the heartbeat. The monitor is a small, portable device. You can wear one while you do your normal daily activities. This is usually used to diagnose what is causing palpitations/syncope (passing out).    Follow-Up: At [facility], you and your health needs are our priority.  As part of our continuing mission to provide you with exceptional heart care, we have created designated Provider Care Teams.  These Care Teams include your primary Cardiologist (physician) and Advanced Practice Providers (APPs -  Physician Assistants and Nurse Practitioners) who all work together to provide you with the care you need, when you need it.  We recommend signing up for the patient portal called "MyChart".  Sign up information is provided on this After Visit Summary.  MyChart is used to connect with patients for Virtual Visits (Telemedicine).  Patients are able to view lab/test results, encounter notes, upcoming appointments, etc.  Non-urgent messages can be sent to your provider as  well.   To learn more about what you can do with MyChart, go to [email or web address].    Your next appointment:   As needed  Provider:   Donato Schultz, MD    Important Information About Sugar

## 2021-08-24 NOTE — Progress Notes (Signed)
Cardiology Office Note:    Date:  08/24/2021   ID:  Amber Soto, DOB 24-Nov-1955, MRN [id number]  PCP:  Mort Sawyers, FNP   [facility] Providers Cardiologist:  Donato Schultz, MD     Referring MD: Mort Sawyers, FNP    History of Present Illness:    Amber Soto is a 66 y.o. female here for the evaluation of irregular heart rhythm at the request of Amber Patricia, FNP.  She has felt her heart fluttering from time to time.  Drinks approximately 2 cups of coffee in the morning.  Drinks 1 to 2 glasses of wine nightly.  Is treated for hypothyroidism, this has been steady for quite some time.  She has had a heart murmur for quite some time.  No high risk symptoms such as syncope chest pain shortness of breath.  She is here today with her husband.  They have a puppy named rooster, doodle.  Her grandmother had use of nitroglycerin she remembers growing up.  EKG shows sinus rhythm 69 with left atrial enlargement.  Past Medical History:  Diagnosis Date   ALLERGIC RHINITIS 01/30/2007   Allergy    ANEMIA-NOS 01/30/2007   CAROTID BRUIT, RIGHT 01/30/2007   DIVERTICULUM, BLADDER 01/30/2007   HEART MURMUR, HX OF 01/30/2007   Unspecified hypothyroidism 02/25/2009   UTI'S, HX OF 01/30/2007    Past Surgical History:  Procedure Laterality Date   COLONOSCOPY     10 yrs ago- normal   none      Current Medications: Current Meds  Medication Sig   Calcium Carbonate (CALCIUM 600 PO) Take by mouth.   Cholecalciferol (VITAMIN D3) 50 MCG (2000 UT) TABS Take 2,000 Int'l Units by mouth daily.   ciprofloxacin (CIPRO) 500 MG tablet Take 500 mg by mouth as needed (after sex).   cyanocobalamin (,VITAMIN B-12,) 1000 MCG/ML injection Inject 1 mL (1,000 mcg total) into the muscle every 30 (thirty) days. Please include 25 g IM needle and syringe as well   Cyanocobalamin (B-12) 1000 MCG CAPS Take 1,000 mcg by mouth daily.   denosumab (PROLIA) 60 MG/ML SOLN injection Inject 60 mg into the skin every 6  (six) months. Administer in upper arm, thigh, or abdomen   diclofenac Sodium (VOLTAREN) 1 % GEL Apply 1 application topically 4 (four) times daily. Prn pain   estradiol (VAGIFEM) 25 MCG vaginal tablet Place 25 mcg vaginally 2 (two) times a week.   Fluticasone Propionate (FLONASE ALLERGY RELIEF NA) Place 1 spray into the nose daily as needed.   levothyroxine (SYNTHROID) 75 MCG tablet TAKE 1 TABLET BY MOUTH EVERY DAY IN THE MORNING   naproxen (NAPROSYN) 500 MG tablet Take 500 mg by mouth 2 (two) times daily with a meal. Prn pain     Allergies:   Macrobid [nitrofurantoin monohyd macro]   Social History   Socioeconomic History   Marital status: Married    Spouse name: Not on file   Number of children: 2   Years of education: 16   Highest education level: Not on file  Occupational History   Occupation: Fish farm manager  Tobacco Use   Smoking status: Never   Smokeless tobacco: Never  Vaping Use   Vaping Use: Never used  Substance and Sexual Activity   Alcohol use: Yes    Alcohol/week: 7.0 standard drinks of alcohol    Types: 7 Glasses of wine per week    Comment: 1 a night   Drug use: Yes    Frequency: 1.0 times per week  Types: Marijuana   Sexual activity: Yes    Partners: Male  Other Topics Concern   Not on file  Social History Narrative   [place]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 Married 1979. 1 daughter 1985-married RN; 1 son 73 [place]-graduated, works [employer]-married '14. 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 SO in good health   Social Determinants of Health   Financial Resource Strain: Not on file  Food Insecurity: Not on file  Transportation Needs: Not on file  Physical Activity: Not on file  Stress: Not on file  Social Connections: Not on file     Family History: The patient's family history includes Alcohol abuse in her father and mother; Arthritis in her brother and maternal aunt; Cancer in her maternal aunt; Colon polyps in her brother;  Depression in her brother; Diabetes in an other family member; Hypertension in her mother; Stroke in her mother; Thyroid disease in her mother. There is no history of Colon cancer.  ROS:   Please see the history of present illness.     All other systems reviewed and are negative.  EKGs/Labs/Other Studies Reviewed:    The following studies were reviewed today: Prior office notes, lab work, EKG reviewed  EKG: As above  Recent Labs: 01/20/2021: ALT 16 07/21/2021: BUN 16; Creatinine, Ser 0.67; Hemoglobin 13.6; Platelets 195.0; Potassium 3.9; Sodium 140; TSH 1.21  Recent Lipid Panel    Component Value Date/Time   CHOL 210 (H) 01/20/2021 1138   CHOL 201 (H) 12/15/2017 1110   TRIG 122.0 01/20/2021 1138   HDL 126.10 01/20/2021 1138   HDL 104 12/15/2017 1110   CHOLHDL 2 01/20/2021 1138   VLDL 24.4 01/20/2021 1138   LDLCALC 59 01/20/2021 1138   LDLCALC 83 12/15/2017 1110   LDLCALC 60 12/31/2016 1600     Risk Assessment/Calculations:              Physical Exam:    VS:  BP 120/70 (BP Location: Left Arm, Patient Position: Sitting, Cuff Size: Normal)   Pulse 88   Ht 5' 6.5" (1.689 m)   Wt 146 lb (66.2 kg)   SpO2 98%   BMI 23.21 kg/m     Wt Readings from Last 3 Encounters:  08/24/21 146 lb (66.2 kg)  07/21/21 143 lb 4 oz (65 kg)  01/20/21 141 lb (64 kg)     GEN:  Well nourished, well developed in no acute distress HEENT: Normal NECK: No JVD; No carotid bruits LYMPHATICS: No lymphadenopathy CARDIAC: RRR, 2/6 systolic murmur right upper sternal border, no rubs, gallops RESPIRATORY:  Clear to auscultation without rales, wheezing or rhonchi  ABDOMEN: Soft, non-tender, non-distended MUSCULOSKELETAL:  No edema; No deformity  SKIN: Warm and dry NEUROLOGIC:  Alert and oriented x 3 PSYCHIATRIC:  Normal affect   ASSESSMENT:    1. Palpitations   2. Heart murmur   3. Acquired hypothyroidism    PLAN:    In order of problems listed above:  Palpitations We will check a  ZIO monitor 14-day.  Differential includes PACs, PVCs.  I think based upon her story be unusual to have atrial fibrillation however this is important to rule out/exclude.  If it is just PVCs or PACs, continue with conservative management strategies such as decreasing caffeine, good sleep hygiene, exercise and reassurance.  Heart murmur We will go ahead and check an echocardiogram to ensure proper structure and function of her heart.  Murmur appreciated systolic at right sternal border  Hypothyroidism TSH 1.21.  Excellent.  Levothyroxine 75 mcg daily for prescription drug management.   We will follow-up with results of testing       Medication Adjustments/Labs and Tests Ordered: Current medicines are reviewed at length with the patient today.  Concerns regarding medicines are outlined above.  Orders Placed This Encounter  Procedures   LONG TERM MONITOR (3-14 DAYS)   ECHOCARDIOGRAM COMPLETE   No orders of the defined types were placed in this encounter.   Patient Instructions  Medication Instructions:  Your physician recommends that you continue on your current medications as directed. Please refer to the Current Medication list given to you today. ' *If you need a refill on your cardiac medications before your next appointment, please call your pharmacy*     Testing/Procedures: Your physician has requested that you have an echocardiogram. Echocardiography is a painless test that uses sound waves to create images of your heart. It provides your doctor with information about the size and shape of your heart and how well your heart's chambers and valves are working. This procedure takes approximately one hour. There are no restrictions for this procedure.   Your physician has recommended that you wear an event monitor. Event monitors are medical devices that record the heart's electrical activity. Doctors most often [place] these monitors to diagnose arrhythmias. Arrhythmias are problems  with the speed or rhythm of the heartbeat. The monitor is a small, portable device. You can wear one while you do your normal daily activities. This is usually used to diagnose what is causing palpitations/syncope (passing out).    Follow-Up: At [facility], you and your health needs are our priority.  As part of our continuing mission to provide you with exceptional heart care, we have created designated Provider Care Teams.  These Care Teams include your primary Cardiologist (physician) and Advanced Practice Providers (APPs -  Physician Assistants and Nurse Practitioners) who all work together to provide you with the care you need, when you need it.  We recommend signing up for the patient portal called "MyChart".  Sign up information is provided on this After Visit Summary.  MyChart is used to connect with patients for Virtual Visits (Telemedicine).  Patients are able to view lab/test results, encounter notes, upcoming appointments, etc.  Non-urgent messages can be sent to your provider as well.   To learn more about what you can do with MyChart, go to [email or web address].    Your next appointment:   As needed  Provider:   Donato Schultz, MD    Important Information About Sugar         Signed, Donato Schultz, MD  08/24/2021 12:20 PM    [facility]

## 2021-08-24 NOTE — Assessment & Plan Note (Signed)
TSH 1.21.  Excellent.  Levothyroxine 75 mcg daily for prescription drug management.

## 2021-08-24 NOTE — Assessment & Plan Note (Signed)
We will check a ZIO monitor 14-day.  Differential includes PACs, PVCs.  I think based upon her story be unusual to have atrial fibrillation however this is important to rule out/exclude.  If it is just PVCs or PACs, continue with conservative management strategies such as decreasing caffeine, good sleep hygiene, exercise and reassurance.

## 2021-09-09 ENCOUNTER — Ambulatory Visit (HOSPITAL_COMMUNITY): Payer: Medicare HMO | Attending: Cardiology

## 2021-09-09 DIAGNOSIS — R011 Cardiac murmur, unspecified: Secondary | ICD-10-CM | POA: Diagnosis present

## 2021-09-09 LAB — ECHOCARDIOGRAM COMPLETE
Area-P 1/2: 3.43 cm2
P 1/2 time: 420 msec
S' Lateral: 2.4 cm

## 2021-10-14 ENCOUNTER — Telehealth: Payer: Self-pay

## 2021-10-14 DIAGNOSIS — E538 Deficiency of other specified B group vitamins: Secondary | ICD-10-CM

## 2021-10-14 NOTE — Telephone Encounter (Signed)
Benefits submitted, pending Next injection due 11/01/21

## 2021-10-16 NOTE — Telephone Encounter (Signed)
Left message to call back OOP cost is $20

## 2021-10-26 NOTE — Telephone Encounter (Signed)
Patient called in returning a call she received. Thank you! 

## 2021-10-26 NOTE — Telephone Encounter (Signed)
Spoke with patient. Scheduled Lab on 9/13 and OV on 11/04/21.  Amber Soto, patient has follow up visit with you scheduled for 11/04/21 for f/u on B12. She is coming in on 9/13 to get labs done for Prolia, can she get B12 levels done then also so she is not been stuck twice? Please order if ok to do so.

## 2021-10-26 NOTE — Addendum Note (Signed)
Addended by: Consuella Lose on: 10/26/2021 12:06 PM   Modules accepted: Orders

## 2021-10-28 ENCOUNTER — Other Ambulatory Visit (INDEPENDENT_AMBULATORY_CARE_PROVIDER_SITE_OTHER): Payer: Medicare HMO

## 2021-10-28 DIAGNOSIS — E538 Deficiency of other specified B group vitamins: Secondary | ICD-10-CM | POA: Diagnosis not present

## 2021-10-28 DIAGNOSIS — M2550 Pain in unspecified joint: Secondary | ICD-10-CM

## 2021-10-28 DIAGNOSIS — M79641 Pain in right hand: Secondary | ICD-10-CM

## 2021-10-28 DIAGNOSIS — R5383 Other fatigue: Secondary | ICD-10-CM

## 2021-10-28 LAB — BASIC METABOLIC PANEL
BUN: 13 mg/dL (ref 6–23)
CO2: 27 mEq/L (ref 19–32)
Calcium: 9.2 mg/dL (ref 8.4–10.5)
Chloride: 104 mEq/L (ref 96–112)
Creatinine, Ser: 0.79 mg/dL (ref 0.40–1.20)
GFR: 77.38 mL/min (ref >60.00)
Glucose, Bld: 89 mg/dL (ref 70–99)
Potassium: 4.3 mEq/L (ref 3.5–5.1)
Sodium: 140 mEq/L (ref 135–145)

## 2021-10-28 LAB — VITAMIN B12: Vitamin B-12: 516 pg/mL (ref 211–911)

## 2021-10-28 NOTE — Addendum Note (Signed)
Addended by: Consuella Lose on: 10/28/2021 09:31 AM   Modules accepted: Orders

## 2021-10-28 NOTE — Telephone Encounter (Signed)
Spoke with [place] about this. Advised that it would just push Prolia injection a little further out. Tabitha said to just keep things as scheduled for now and if patient needs more labs during the visit next week patient can get it done at a later time. Advised patient about this, and that basically she would be stuck twice most likely. Patient is ok with proceeding as scheduled at this time.

## 2021-10-28 NOTE — Telephone Encounter (Signed)
Is it possible to get labs done for prolia instead on 9/20 sorry for the late response. I hate to think we need more labs at time of visit, and she will still result in getting stuck twice.

## 2021-10-29 LAB — RHEUMATOID FACTOR: Rheumatoid fact SerPl-aCnc: <14 IU/mL (ref <14)

## 2021-10-29 NOTE — Telephone Encounter (Signed)
CrCl is  72.22 mL/min Calcium 9.2

## 2021-10-29 NOTE — Progress Notes (Signed)
Did you need to see results?

## 2021-11-01 ENCOUNTER — Encounter: Payer: Self-pay | Admitting: Family

## 2021-11-01 ENCOUNTER — Other Ambulatory Visit: Payer: Self-pay | Admitting: Family

## 2021-11-01 DIAGNOSIS — E538 Deficiency of other specified B group vitamins: Secondary | ICD-10-CM

## 2021-11-02 NOTE — Telephone Encounter (Signed)
Good morning Amber Soto  We can  give you the flu shot while you are here on Wednesday. See you then.

## 2021-11-04 ENCOUNTER — Ambulatory Visit: Payer: Medicare HMO

## 2021-11-04 ENCOUNTER — Ambulatory Visit (INDEPENDENT_AMBULATORY_CARE_PROVIDER_SITE_OTHER): Payer: Medicare HMO | Admitting: Family

## 2021-11-04 ENCOUNTER — Encounter: Payer: Self-pay | Admitting: Family

## 2021-11-04 VITALS — BP 124/64 | HR 71 | Temp 97.9°F | Resp 16 | Ht 66.5 in | Wt 143.0 lb

## 2021-11-04 DIAGNOSIS — E538 Deficiency of other specified B group vitamins: Secondary | ICD-10-CM | POA: Diagnosis not present

## 2021-11-04 DIAGNOSIS — M810 Age-related osteoporosis without current pathological fracture: Secondary | ICD-10-CM | POA: Diagnosis not present

## 2021-11-04 DIAGNOSIS — R002 Palpitations: Secondary | ICD-10-CM

## 2021-11-04 MED ORDER — DENOSUMAB 60 MG/ML ~~LOC~~ SOSY
60.0000 mg | PREFILLED_SYRINGE | Freq: Once | SUBCUTANEOUS | Status: AC
  Administered 2021-11-04: 60 mg via SUBCUTANEOUS

## 2021-11-04 NOTE — Assessment & Plan Note (Signed)
otc b12 1000 mcg once daily

## 2021-11-04 NOTE — Progress Notes (Signed)
Established Patient Office Visit  Subjective:  Patient ID: Amber Soto, female    DOB: 1955-06-01  Age: 66 y.o. MRN: [id number]  CC:  Chief Complaint  Patient presents with   Medication Consultation    Lexis Potenza sent in B-12 injection to pharmacy. Last labs Makinzy Cleere advised her to take OTC    HPI Amber Soto is here today for follow up.   Pt is with acute concerns.  Palpitations: seen by cardiologist, zio monitor and each unremarkable   Vitamin b12 def: has been getting injections, 3/3 have been completed. Also taking daily vitamin B12 1000 mcg once daily.fatigue has also improved some. States with b12 her neuralgia in her right hand has resolved. Lab Results  Component Value Date   VITAMINB12 516 10/28/2021   Hypothyroid: doing well on levothyroxine 75 mcg once daily , takes separate from food and other medications.   Past Medical History:  Diagnosis Date   ALLERGIC RHINITIS 01/30/2007   Allergy    ANEMIA-NOS 01/30/2007   CAROTID BRUIT, RIGHT 01/30/2007   DIVERTICULUM, BLADDER 01/30/2007   HEART MURMUR, HX OF 01/30/2007   Unspecified hypothyroidism 02/25/2009   UTI'S, HX OF 01/30/2007    Past Surgical History:  Procedure Laterality Date   COLONOSCOPY     10 yrs ago- normal   none      Family History  Problem Relation Age of Onset   Hypertension Mother    Thyroid disease Mother        hypothyroid   Alcohol abuse Mother    Stroke Mother    Alcohol abuse Father        in remission prior to death   Arthritis Brother    Depression Brother    Colon polyps Brother    Cancer Maternal Aunt        bone cancer   Arthritis Maternal Aunt        RA   Diabetes Other    Colon cancer Neg Hx     Social History   Socioeconomic History   Marital status: Married    Spouse name: Not on file   Number of children: 2   Years of education: 16   Highest education level: Not on file  Occupational History   Occupation: Fish farm manager  Tobacco Use   Smoking  status: Never   Smokeless tobacco: Never  Vaping Use   Vaping Use: Never used  Substance and Sexual Activity   Alcohol use: Yes    Alcohol/week: 7.0 standard drinks of alcohol    Types: 7 Glasses of wine per week    Comment: 1 a night   Drug use: Yes    Frequency: 1.0 times per week    Types: Marijuana   Sexual activity: Yes    Partners: Male  Other Topics Concern   Not on file  Social History Narrative   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 Married 1979. 1 daughter 1985-married RN; 1 son 28 [place]-graduated, works [place]-married '14. 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 SO in good health   Social Determinants of Health   Financial Resource Strain: Not on file  Food Insecurity: Not on file  Transportation Needs: Not on file  Physical Activity: Not on file  Stress: Not on file  Social Connections: Not on file  Intimate Partner Violence: Not on file    Outpatient Medications Prior to Visit  Medication Sig Dispense Refill   Calcium Carbonate (CALCIUM 600 PO) Take by mouth.  Cholecalciferol (VITAMIN D3) 50 MCG (2000 UT) TABS Take 2,000 Int'l Units by mouth daily.     ciprofloxacin (CIPRO) 500 MG tablet Take 500 mg by mouth as needed (after sex).     Cyanocobalamin (B-12) 1000 MCG CAPS Take 1,000 mcg by mouth daily.     denosumab (PROLIA) 60 MG/ML SOLN injection Inject 60 mg into the skin every 6 (six) months. Administer in upper arm, thigh, or abdomen     diclofenac Sodium (VOLTAREN) 1 % GEL Apply 1 application topically 4 (four) times daily. Prn pain     estradiol (VAGIFEM) 25 MCG vaginal tablet Place 25 mcg vaginally 2 (two) times a week.     Fluticasone Propionate (FLONASE ALLERGY RELIEF NA) Place 1 spray into the nose daily as needed.     levothyroxine (SYNTHROID) 75 MCG tablet TAKE 1 TABLET BY MOUTH EVERY DAY IN THE MORNING 90 tablet 1   naproxen (NAPROSYN) 500 MG tablet Take 500 mg by mouth 2 (two) times daily with a meal. Prn pain      cyanocobalamin (VITAMIN B12) 1000 MCG/ML injection INJECT 1 ML INTO THE MUSCLE EVERY 30 DAYS. PLEASE INCLUDE 25 G IM NEEDLE AND SYRINGE AS WELL 3 mL 0   No facility-administered medications prior to visit.    Allergies  Allergen Reactions   Macrobid [Nitrofurantoin Monohyd Macro] Hives          Objective:    Physical Exam Constitutional:      Appearance: Normal appearance. She is obese.  Neck:     Thyroid: No thyroid mass, thyromegaly or thyroid tenderness.  Cardiovascular:     Rate and Rhythm: Normal rate and regular rhythm.  Pulmonary:     Effort: Pulmonary effort is normal.  Neurological:     General: No focal deficit present.     Mental Status: She is alert and oriented to person, place, and time. Mental status is at baseline.  Psychiatric:        Mood and Affect: Mood normal.        Behavior: Behavior normal.        Thought Content: Thought content normal.        Judgment: Judgment normal.       BP 124/64   Pulse 71   Temp 97.9 F (36.6 C)   Resp 16   Ht 5' 6.5" (1.689 m)   Wt 143 lb (64.9 kg)   SpO2 99%   BMI 22.74 kg/m  Wt Readings from Last 3 Encounters:  11/04/21 143 lb (64.9 kg)  08/24/21 146 lb (66.2 kg)  07/21/21 143 lb 4 oz (65 kg)     Health Maintenance Due  Topic Date Due   Zoster Vaccines- Shingrix (1 of 2) Never done   COVID-19 Vaccine (4 - Moderna series) 02/12/2020   MAMMOGRAM  01/30/2021   INFLUENZA VACCINE  09/15/2021    There are no preventive care reminders to display for this patient.  Lab Results  Component Value Date   TSH 1.21 07/21/2021   Lab Results  Component Value Date   WBC 5.9 07/21/2021   HGB 13.6 07/21/2021   HCT 41.2 07/21/2021   MCV 101.8 (H) 07/21/2021   PLT 195.0 07/21/2021   Lab Results  Component Value Date   NA 140 10/28/2021   K 4.3 10/28/2021   CO2 27 10/28/2021   GLUCOSE 89 10/28/2021   BUN 13 10/28/2021   CREATININE 0.79 10/28/2021   BILITOT 0.7 01/20/2021   ALKPHOS 43 01/20/2021  AST 18 01/20/2021   ALT 16 01/20/2021   PROT 7.1 01/20/2021   ALBUMIN 4.6 01/20/2021   CALCIUM 9.2 10/28/2021   GFR 77.38 10/28/2021   Lab Results  Component Value Date   CHOL 210 (H) 01/20/2021   Lab Results  Component Value Date   HDL 126.10 01/20/2021   Lab Results  Component Value Date   LDLCALC 59 01/20/2021   Lab Results  Component Value Date   TRIG 122.0 01/20/2021   Lab Results  Component Value Date   CHOLHDL 2 01/20/2021   Lab Results  Component Value Date   HGBA1C 5.7 01/21/2021      Assessment & Plan:   Problem List Items Addressed This Visit       Musculoskeletal and Integument   Osteoporosis - Primary    Prolia injection today and then again every 6 months. administered in office Last dexa 01/16/20 Pt tolerated procedure well  Verbal consent obtained prior to administration          Other   Palpitations    F/u with cardiologist prn       Vitamin B12 deficiency    otc b12 1000 mcg once daily         Meds ordered this encounter  Medications   denosumab (PROLIA) injection 60 mg    Order Specific Question:   Patient is enrolled in [facility] program for this medication and I have provided a copy of the Prolia Medication Guide and Patient Brochure.    Answer:   No    Order Specific Question:   I have reviewed with the patient the information in the Prolia Medication Guide and Patient Counseling Chart including the serious risks of Prolia and symptoms of each risk.    Answer:   No    Order Specific Question:   I have advised the patient to seek medical attention if they have signs or symptoms of any of the serious risks.    Answer:   No    Follow-up: Return in about 6 months (around 05/05/2022) for f/u thyroid.    Eugenia Pancoast, FNP

## 2021-11-04 NOTE — Assessment & Plan Note (Addendum)
Prolia injection today and then again every 6 months. administered in office Last dexa 01/16/20 Pt tolerated procedure well  Verbal consent obtained prior to administration

## 2021-11-04 NOTE — Assessment & Plan Note (Signed)
F/u with cardiologist prn

## 2021-12-15 ENCOUNTER — Ambulatory Visit: Payer: Medicare HMO

## 2022-01-29 ENCOUNTER — Other Ambulatory Visit: Payer: Self-pay | Admitting: Family

## 2022-01-29 DIAGNOSIS — E039 Hypothyroidism, unspecified: Secondary | ICD-10-CM

## 2022-03-02 ENCOUNTER — Telehealth: Payer: Self-pay | Admitting: Family

## 2022-03-02 NOTE — Telephone Encounter (Signed)
LVM for pt to rtn my call to schedule AWV with [facility] call back # [phone] 

## 2022-03-15 ENCOUNTER — Ambulatory Visit (INDEPENDENT_AMBULATORY_CARE_PROVIDER_SITE_OTHER): Payer: Medicare HMO

## 2022-03-15 VITALS — Wt 143.0 lb

## 2022-03-15 DIAGNOSIS — Z0000 Encounter for general adult medical examination without abnormal findings: Secondary | ICD-10-CM | POA: Diagnosis not present

## 2022-03-15 NOTE — Patient Instructions (Signed)
[name] , Thank you for taking time to come for your Medicare Wellness Visit. I appreciate your ongoing commitment to your health goals. Please review the following plan we discussed and let me know if I can assist you in the future.   These are the goals we discussed:  Goals      Patient Stated     Get a little more exercise         This is a list of the screening recommended for you and due dates:  Health Maintenance  Topic Date Due   Zoster (Shingles) Vaccine (1 of 2) Never done   Mammogram  01/30/2021   COVID-19 Vaccine (4 - 2023-24 season) 10/16/2021   Medicare Annual Wellness Visit  03/16/2023   DEXA scan (bone density measurement)  01/15/2025   Colon Cancer Screening  10/23/2025   DTaP/Tdap/Td vaccine (3 - Tdap) 06/25/2030   Pneumonia Vaccine  Completed   Flu Shot  Completed   Hepatitis C Screening: USPSTF Recommendation to screen - Ages 18-79 yo.  Completed   HPV Vaccine  Aged Out    Advanced directives: Please bring a copy of your health care power of attorney and living will to the office at your convenience.  Conditions/risks identified: get a little more exercise   Next appointment: Follow up in one year for your annual wellness visit    Preventive Care 65 Years and Older, Female Preventive care refers to lifestyle choices and visits with your health care provider that can promote health and wellness. What does preventive care include? A yearly physical exam. This is also called an annual well check. Dental exams once or twice a year. Routine eye exams. Ask your health care provider how often you should have your eyes checked. Personal lifestyle choices, including: Daily care of your teeth and gums. Regular physical activity. Eating a healthy diet. Avoiding tobacco and drug use. Limiting alcohol use. Practicing safe sex. Taking low-dose aspirin every day. Taking vitamin and mineral supplements as recommended by your health care provider. What happens  during an annual well check? The services and screenings done by your health care provider during your annual well check will depend on your age, overall health, lifestyle risk factors, and family history of disease. Counseling  Your health care provider may ask you questions about your: Alcohol use. Tobacco use. Drug use. Emotional well-being. Home and relationship well-being. Sexual activity. Eating habits. History of falls. Memory and ability to understand (cognition). Work and work Statistician. Reproductive health. Screening  You may have the following tests or measurements: Height, weight, and BMI. Blood pressure. Lipid and cholesterol levels. These may be checked every 5 years, or more frequently if you are over 34 years old. Skin check. Lung cancer screening. You may have this screening every year starting at age 68 if you have a 30-pack-year history of smoking and currently smoke or have quit within the past 15 years. Fecal occult blood test (FOBT) of the stool. You may have this test every year starting at age 33. Flexible sigmoidoscopy or colonoscopy. You may have a sigmoidoscopy every 5 years or a colonoscopy every 10 years starting at age 58. Hepatitis C blood test. Hepatitis B blood test. Sexually transmitted disease (STD) testing. Diabetes screening. This is done by checking your blood sugar (glucose) after you have not eaten for a while (fasting). You may have this done every 1-3 years. Bone density scan. This is done to screen for osteoporosis. You may have this done starting  at age 21. Mammogram. This may be done every 1-2 years. Talk to your health care provider about how often you should have regular mammograms. Talk with your health care provider about your test results, treatment options, and if necessary, the need for more tests. Vaccines  Your health care provider may recommend certain vaccines, such as: Influenza vaccine. This is recommended every  year. Tetanus, diphtheria, and acellular pertussis (Tdap, Td) vaccine. You may need a Td booster every 10 years. Zoster vaccine. You may need this after age 22. Pneumococcal 13-valent conjugate (PCV13) vaccine. One dose is recommended after age 41. Pneumococcal polysaccharide (PPSV23) vaccine. One dose is recommended after age 15. Talk to your health care provider about which screenings and vaccines you need and how often you need them. This information is not intended to replace advice given to you by your health care provider. Make sure you discuss any questions you have with your health care provider. Document Released: 02/28/2015 Document Revised: 10/22/2015 Document Reviewed: 12/03/2014 Elsevier Interactive Patient Education  2017 [place] Prevention in the Home Falls can cause injuries. They can happen to people of all ages. There are many things you can do to make your home safe and to help prevent falls. What can I do on the outside of my home? Regularly fix the edges of walkways and driveways and fix any cracks. Remove anything that might make you trip as you walk through a door, such as a raised step or threshold. Trim any bushes or trees on the path to your home. Use bright outdoor lighting. Clear any walking paths of anything that might make someone trip, such as rocks or tools. Regularly check to see if handrails are loose or broken. Make sure that both sides of any steps have handrails. Any raised decks and porches should have guardrails on the edges. Have any leaves, snow, or ice cleared regularly. Use sand or salt on walking paths during winter. Clean up any spills in your garage right away. This includes oil or grease spills. What can I do in the bathroom? Use night lights. Install grab bars by the toilet and in the tub and shower. Do not use towel bars as grab bars. Use non-skid mats or decals in the tub or shower. If you need to sit down in the shower, use a  plastic, non-slip stool. Keep the floor dry. Clean up any water that spills on the floor as soon as it happens. Remove soap buildup in the tub or shower regularly. Attach bath mats securely with double-sided non-slip rug tape. Do not have throw rugs and other things on the floor that can make you trip. What can I do in the bedroom? Use night lights. Make sure that you have a light by your bed that is easy to reach. Do not use any sheets or blankets that are too big for your bed. They should not hang down onto the floor. Have a firm chair that has side arms. You can use this for support while you get dressed. Do not have throw rugs and other things on the floor that can make you trip. What can I do in the kitchen? Clean up any spills right away. Avoid walking on wet floors. Keep items that you use a lot in easy-to-reach places. If you need to reach something above you, use a strong step stool that has a grab bar. Keep electrical cords out of the way. Do not use floor polish or wax that makes  floors slippery. If you must use wax, use non-skid floor wax. Do not have throw rugs and other things on the floor that can make you trip. What can I do with my stairs? Do not leave any items on the stairs. Make sure that there are handrails on both sides of the stairs and use them. Fix handrails that are broken or loose. Make sure that handrails are as long as the stairways. Check any carpeting to make sure that it is firmly attached to the stairs. Fix any carpet that is loose or worn. Avoid having throw rugs at the top or bottom of the stairs. If you do have throw rugs, attach them to the floor with carpet tape. Make sure that you have a light switch at the top of the stairs and the bottom of the stairs. If you do not have them, ask someone to add them for you. What else can I do to help prevent falls? Wear shoes that: Do not have high heels. Have rubber bottoms. Are comfortable and fit you  well. Are closed at the toe. Do not wear sandals. If you use a stepladder: Make sure that it is fully opened. Do not climb a closed stepladder. Make sure that both sides of the stepladder are locked into place. Ask someone to hold it for you, if possible. Clearly mark and make sure that you can see: Any grab bars or handrails. First and last steps. Where the edge of each step is. Use tools that help you move around (mobility aids) if they are needed. These include: Canes. Walkers. Scooters. Crutches. Turn on the lights when you go into a dark area. Replace any light bulbs as soon as they burn out. Set up your furniture so you have a clear path. Avoid moving your furniture around. If any of your floors are uneven, fix them. If there are any pets around you, be aware of where they are. Review your medicines with your doctor. Some medicines can make you feel dizzy. This can increase your chance of falling. Ask your doctor what other things that you can do to help prevent falls. This information is not intended to replace advice given to you by your health care provider. Make sure you discuss any questions you have with your health care provider. Document Released: 11/28/2008 Document Revised: 07/10/2015 Document Reviewed: 03/08/2014 Elsevier Interactive Patient Education  2017 [employer].

## 2022-03-15 NOTE — Progress Notes (Signed)
I connected with  [name] on 03/15/22 by a audio enabled telemedicine application and verified that I am speaking with the correct person using two identifiers.  Patient Location: Home  Provider Location: Office/Clinic  I discussed the limitations of evaluation and management by telemedicine. The patient expressed understanding and agreed to proceed.   Subjective:   Amber Soto is a 67 y.o. female who presents for an Initial Medicare Annual Wellness Visit.  Review of Systems     Cardiac Risk Factors include: advanced age (>43men, >51 women)     Objective:    Today's Vitals   03/15/22 1533  Weight: 143 lb (64.9 kg)   Body mass index is 22.74 kg/m.     03/15/2022    3:37 PM 10/10/2015    2:25 PM  Advanced Directives  Does Patient Have a Medical Advance Directive? Yes No  Type of Estate agent of [place];Living will   Copy of Healthcare Power of Attorney in Chart? No - copy requested   Would patient like information on creating a medical advance directive?  No - patient declined information    Current Medications (verified) Outpatient Encounter Medications as of 03/15/2022  Medication Sig   Calcium Carbonate (CALCIUM 600 PO) Take by mouth.   Cholecalciferol (VITAMIN D3) 50 MCG (2000 UT) TABS Take 2,000 Int'l Units by mouth daily.   ciprofloxacin (CIPRO) 500 MG tablet Take 500 mg by mouth as needed (after sex).   Cyanocobalamin (B-12) 1000 MCG CAPS Take 1,000 mcg by mouth daily.   denosumab (PROLIA) 60 MG/ML SOLN injection Inject 60 mg into the skin every 6 (six) months. Administer in upper arm, thigh, or abdomen   diclofenac Sodium (VOLTAREN) 1 % GEL Apply 1 application topically 4 (four) times daily. Prn pain   estradiol (VAGIFEM) 25 MCG vaginal tablet Place 25 mcg vaginally 2 (two) times a week.   Fluticasone Propionate (FLONASE ALLERGY RELIEF NA) Place 1 spray into the nose daily as needed.   levothyroxine (SYNTHROID) 75 MCG tablet TAKE 1  TABLET BY MOUTH EVERY DAY IN THE MORNING   naproxen (NAPROSYN) 500 MG tablet Take 500 mg by mouth 2 (two) times daily with a meal. Prn pain   No facility-administered encounter medications on file as of 03/15/2022.    Allergies (verified) Macrobid [nitrofurantoin monohyd macro]   History: Past Medical History:  Diagnosis Date   ALLERGIC RHINITIS 01/30/2007   Allergy    ANEMIA-NOS 01/30/2007   CAROTID BRUIT, RIGHT 01/30/2007   DIVERTICULUM, BLADDER 01/30/2007   HEART MURMUR, HX OF 01/30/2007   Unspecified hypothyroidism 02/25/2009   UTI'S, HX OF 01/30/2007   Past Surgical History:  Procedure Laterality Date   COLONOSCOPY     10 yrs ago- normal   none     Family History  Problem Relation Age of Onset   Hypertension Mother    Thyroid disease Mother        hypothyroid   Alcohol abuse Mother    Stroke Mother    Alcohol abuse Father        in remission prior to death   Arthritis Brother    Depression Brother    Colon polyps Brother    Cancer Maternal Aunt        bone cancer   Arthritis Maternal Aunt        RA   Diabetes Other    Colon cancer Neg Hx    Social History   Socioeconomic History   Marital status: Married  Spouse name: Not on file   Number of children: 2   Years of education: 72   Highest education level: Not on file  Occupational History   Occupation: Retail Cosmetic sales  Tobacco Use   Smoking status: Never   Smokeless tobacco: Never  Vaping Use   Vaping Use: Never used  Substance and Sexual Activity   Alcohol use: Yes    Alcohol/week: 7.0 standard drinks of alcohol    Types: 7 Glasses of wine per week    Comment: 1 a night   Drug use: Not Currently    Frequency: 1.0 times per week    Types: Marijuana   Sexual activity: Yes    Partners: Male  Other Topics Concern   Not on file  Social History Narrative   [place]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 Married 1979. 1 daughter 1985-married RN; 1 son 82 -graduated, works [place]-married '14. Work:  Transport planner at [employer]. Marriage in good health. SO in good health   Social Determinants of Health   Financial Resource Strain: Low Risk  (03/15/2022)   Overall Financial Resource Strain (CARDIA)    Difficulty of Paying Living Expenses: Not hard at all  Food Insecurity: No Food Insecurity (03/15/2022)   Hunger Vital Sign    Worried About Running Out of Food in the Last Year: Never true    Ran Out of Food in the Last Year: Never true  Transportation Needs: No Transportation Needs (03/15/2022)   PRAPARE - Hydrologist (Medical): No    Lack of Transportation (Non-Medical): No  Physical Activity: Insufficiently Active (03/15/2022)   Exercise Vital Sign    Days of Exercise per Week: 2 days    Minutes of Exercise per Session: 30 min  Stress: No Stress Concern Present (03/15/2022)   [place]    Feeling of Stress : Not at all  Social Connections: Painter (03/15/2022)   Social Connection and Isolation Panel [NHANES]    Frequency of Communication with Friends and Family: More than three times a week    Frequency of Social Gatherings with Friends and Family: More than three times a week    Attends Religious Services: More than 4 times per year    Active Member of [employer] or Organizations: Yes    Attends Archivist Meetings: 1 to 4 times per year    Marital Status: Married    Tobacco Counseling Counseling given: Not Answered   Clinical Intake:  Pre-visit preparation completed: Yes  Pain : No/denies pain     BMI - recorded: 22.74 Nutritional Status: BMI of 19-24  Normal Nutritional Risks: None Diabetes: No  How often do you need to have someone help you when you read instructions, pamphlets, or other written materials from your doctor or pharmacy?: 1 - Never  Diabetic?no  Interpreter Needed?: No  Information entered by :: Charlott Rakes,  LPN   Activities of Daily Living    03/15/2022    3:38 PM  In your present state of health, do you have any difficulty performing the following activities:  Hearing? 0  Vision? 0  Difficulty concentrating or making decisions? 0  Walking or climbing stairs? 0  Dressing or bathing? 0  Doing errands, shopping? 0  Preparing Food and eating ? N  Using the Toilet? N  In the past six months, have you accidently leaked urine? N  Do you have problems with loss of bowel control? N  Managing your Medications? N  Managing your Finances? N  Housekeeping or managing your Housekeeping? N    Patient Care Team: Eugenia Pancoast, FNP as PCP - General (Family Medicine) Jerline Pain, MD as PCP - Cardiology (Cardiology)  Indicate any recent Medical Services you may have received from other than [facility] providers in the past year (date may be approximate).     Assessment:   This is a routine wellness examination for [place].  Hearing/Vision screen Hearing Screening - Comments:: Pt denies any hearing issues  Vision Screening - Comments:: [facility]   Dietary issues and exercise activities discussed: Current Exercise Habits: Home exercise routine, Type of exercise: walking, Time (Minutes): 30, Frequency (Times/Week): 2, Weekly Exercise (Minutes/Week): 60   Goals Addressed             This Visit's Progress    Patient Stated       Get a little more exercise        Depression Screen    03/15/2022    3:36 PM 07/21/2021    9:49 AM 01/20/2021   10:38 AM 01/14/2020    9:30 AM 01/08/2019   10:55 AM 01/05/2018    2:24 PM 12/31/2016    3:04 PM  PHQ 2/9 Scores  PHQ - 2 Score 0 0 0 0 0 0 0  PHQ- 9 Score  4  0       Fall Risk    03/15/2022    3:38 PM 01/20/2021   10:39 AM 01/14/2020    9:30 AM 10/31/2014    2:33 PM 10/23/2013    9:51 AM  Fall Risk   Falls in the past year? 0 0 0 No No  Number falls in past yr: 0 0     Injury with Fall? 0 0      Risk for fall due to : Impaired vision      Follow up Falls prevention discussed        FALL RISK PREVENTION PERTAINING TO THE HOME:  Any stairs in or around the home? Yes  If so, are there any without handrails? No  Home free of loose throw rugs in walkways, pet beds, electrical cords, etc? Yes  Adequate lighting in your home to reduce risk of falls? Yes   ASSISTIVE DEVICES UTILIZED TO PREVENT FALLS:  Life alert? No  Use of a cane, walker or w/c? No  Grab bars in the bathroom? No  Shower chair or bench in shower? No  Elevated toilet seat or a handicapped toilet? No   TIMED UP AND GO:  Was the test performed? No .   Cognitive Function:        03/15/2022    3:38 PM  6CIT Screen  What Year? 0 points  What month? 0 points  What time? 0 points  Count back from 20 0 points  Months in reverse 0 points  Repeat phrase 0 points  Total Score 0 points    Immunizations Immunization History  Administered Date(s) Administered   Influenza, Seasonal, Injecte, Preservative Fre 11/25/2016   Influenza,inj,Quad PF,6+ Mos 11/22/2013, 10/31/2014, 11/20/2015, 11/25/2016, 12/15/2017, 11/26/2019   Influenza-Unspecified 11/18/2020, 11/15/2021   Moderna Sars-Covid-2 Vaccination 03/21/2019, 04/18/2019, 12/18/2019   Pneumococcal Conjugate-13 01/14/2020   Pneumococcal Polysaccharide-23 01/20/2021   Td 01/08/2019, 06/24/2020   Zoster, Live 11/20/2015    TDAP status: Up to date  Flu Vaccine status: Up to date  Pneumococcal vaccine status: Up to date  Covid-19 vaccine status: Completed vaccines  Qualifies for Shingles Vaccine? Yes   Zostavax completed No   Shingrix Completed?: No.    Education has been provided regarding the importance of this vaccine. Patient has been advised to call insurance company to determine out of pocket expense if they have not yet received this vaccine. Advised may also receive vaccine at local pharmacy or Health Dept. Verbalized acceptance and  understanding.  Screening Tests Health Maintenance  Topic Date Due   Zoster Vaccines- Shingrix (1 of 2) Never done   MAMMOGRAM  01/30/2021   COVID-19 Vaccine (4 - 2023-24 season) 10/16/2021   Medicare Annual Wellness (AWV)  03/16/2023   DEXA SCAN  01/15/2025   COLONOSCOPY (Pts 45-26yrs Insurance coverage will need to be confirmed)  10/23/2025   DTaP/Tdap/Td (3 - Tdap) 06/25/2030   Pneumonia Vaccine 68+ Years old  Completed   INFLUENZA VACCINE  Completed   Hepatitis C Screening  Completed   HPV VACCINES  Aged Out    Health Maintenance  Health Maintenance Due  Topic Date Due   Zoster Vaccines- Shingrix (1 of 2) Never done   MAMMOGRAM  01/30/2021   COVID-19 Vaccine (4 - 2023-24 season) 10/16/2021    Colorectal cancer screening: Type of screening: Colonoscopy. Completed 10/24/15. Repeat every 10/24/15 years  Mammogram status: Completed 01/31/19. Repeat every year  Bone Density status: Completed 12/31/14. Results reflect: Bone density results: OSTEOPOROSIS. Repeat every 5 years.   Additional Screening:  Hepatitis C Screening:  Completed 11/20/15  Vision Screening: Recommended annual ophthalmology exams for early detection of glaucoma and other disorders of the eye. Is the patient up to date with their annual eye exam?  Yes  Who is the provider or what is the name of the office in which the patient attends annual eye exams? [facility]  If pt is not established with a provider, would they like to be referred to a provider to establish care? No .   Dental Screening: Recommended annual dental exams for proper oral hygiene  Community Resource Referral / Chronic Care Management: CRR required this visit?  No   CCM required this visit?  No      Plan:     I have personally reviewed and noted the following in the patient's chart:   Medical and social history Use of alcohol, tobacco or illicit drugs  Current medications and supplements including opioid prescriptions. Patient  is not currently taking opioid prescriptions. Functional ability and status Nutritional status Physical activity Advanced directives List of other physicians Hospitalizations, surgeries, and ER visits in previous 12 months Vitals Screenings to include cognitive, depression, and falls Referrals and appointments  In addition, I have reviewed and discussed with patient certain preventive protocols, quality metrics, and best practice recommendations. A written personalized care plan for preventive services as well as general preventive health recommendations were provided to patient.     Marzella Schlein, LPN   [id number]   Nurse Notes: none

## 2022-04-19 ENCOUNTER — Telehealth: Payer: Self-pay

## 2022-04-19 NOTE — Telephone Encounter (Signed)
Please start new PA for Prolia for patient.  She is due on or after 05/06/2022 for next inj.  Thanks, Anda Kraft

## 2022-04-19 NOTE — Telephone Encounter (Signed)
Prolia VOB initiated via [email or web address] 

## 2022-04-25 ENCOUNTER — Other Ambulatory Visit: Payer: Self-pay | Admitting: Nurse Practitioner

## 2022-04-25 DIAGNOSIS — E039 Hypothyroidism, unspecified: Secondary | ICD-10-CM

## 2022-04-30 NOTE — Telephone Encounter (Signed)
Following up on prolia authorization

## 2022-05-06 NOTE — Telephone Encounter (Signed)
PA submitted via [employer]. Authorization Number : [id number]. Status pending

## 2022-05-07 ENCOUNTER — Other Ambulatory Visit (HOSPITAL_COMMUNITY): Payer: Self-pay

## 2022-05-07 NOTE — Telephone Encounter (Signed)
Pharmacy Patient Advocate Encounter  Prior Authorization for Amber Soto has been approved by [employer] (ins).    PA # [id number] Effective dates: 05/06/2022 through 05/06/2023  Copay is $60.00  [facility] Patient Advocate [phone]

## 2022-05-11 NOTE — Telephone Encounter (Signed)
Called patient she will be out of town this month. Set up for lab and prolia for when she gets back. Patient aware of copay $60 due at time of visit. Sending reminder to check labs on 06/09/22

## 2022-06-08 ENCOUNTER — Encounter: Payer: Self-pay | Admitting: Family

## 2022-06-08 ENCOUNTER — Other Ambulatory Visit: Payer: Medicare HMO

## 2022-06-08 ENCOUNTER — Telehealth (INDEPENDENT_AMBULATORY_CARE_PROVIDER_SITE_OTHER): Payer: Medicare HMO | Admitting: Family

## 2022-06-08 VITALS — BP 120/72 | HR 73 | Temp 97.8°F | Ht 66.5 in | Wt 145.6 lb

## 2022-06-08 DIAGNOSIS — M810 Age-related osteoporosis without current pathological fracture: Secondary | ICD-10-CM

## 2022-06-08 DIAGNOSIS — E559 Vitamin D deficiency, unspecified: Secondary | ICD-10-CM

## 2022-06-08 DIAGNOSIS — E538 Deficiency of other specified B group vitamins: Secondary | ICD-10-CM

## 2022-06-08 DIAGNOSIS — E119 Type 2 diabetes mellitus without complications: Secondary | ICD-10-CM

## 2022-06-08 DIAGNOSIS — E039 Hypothyroidism, unspecified: Secondary | ICD-10-CM

## 2022-06-08 LAB — BASIC METABOLIC PANEL
BUN: 16 mg/dL (ref 6–23)
CO2: 30 mEq/L (ref 19–32)
Calcium: 9.7 mg/dL (ref 8.4–10.5)
Chloride: 103 mEq/L (ref 96–112)
Creatinine, Ser: 0.83 mg/dL (ref 0.40–1.20)
GFR: 72.61 mL/min (ref >60.00)
Glucose, Bld: 105 mg/dL — ABNORMAL HIGH (ref 70–99)
Potassium: 4 mEq/L (ref 3.5–5.1)
Sodium: 141 mEq/L (ref 135–145)

## 2022-06-08 LAB — VITAMIN B12: Vitamin B-12: 665 pg/mL (ref 211–911)

## 2022-06-08 LAB — VITAMIN D 25 HYDROXY (VIT D DEFICIENCY, FRACTURES): VITD: 36.98 ng/mL (ref 30.00–100.00)

## 2022-06-08 LAB — TSH: TSH: 1.72 u[IU]/mL (ref 0.35–5.50)

## 2022-06-08 IMAGING — DX DG HAND COMPLETE 3+V*R*
3 series · 3 of 3 positions shown · non-contrast
Comparison: Left wrist series 12/24/2016.

CLINICAL DATA: Right hand injury.

EXAM:
RIGHT HAND - COMPLETE 3+ VIEW

[hand pa]
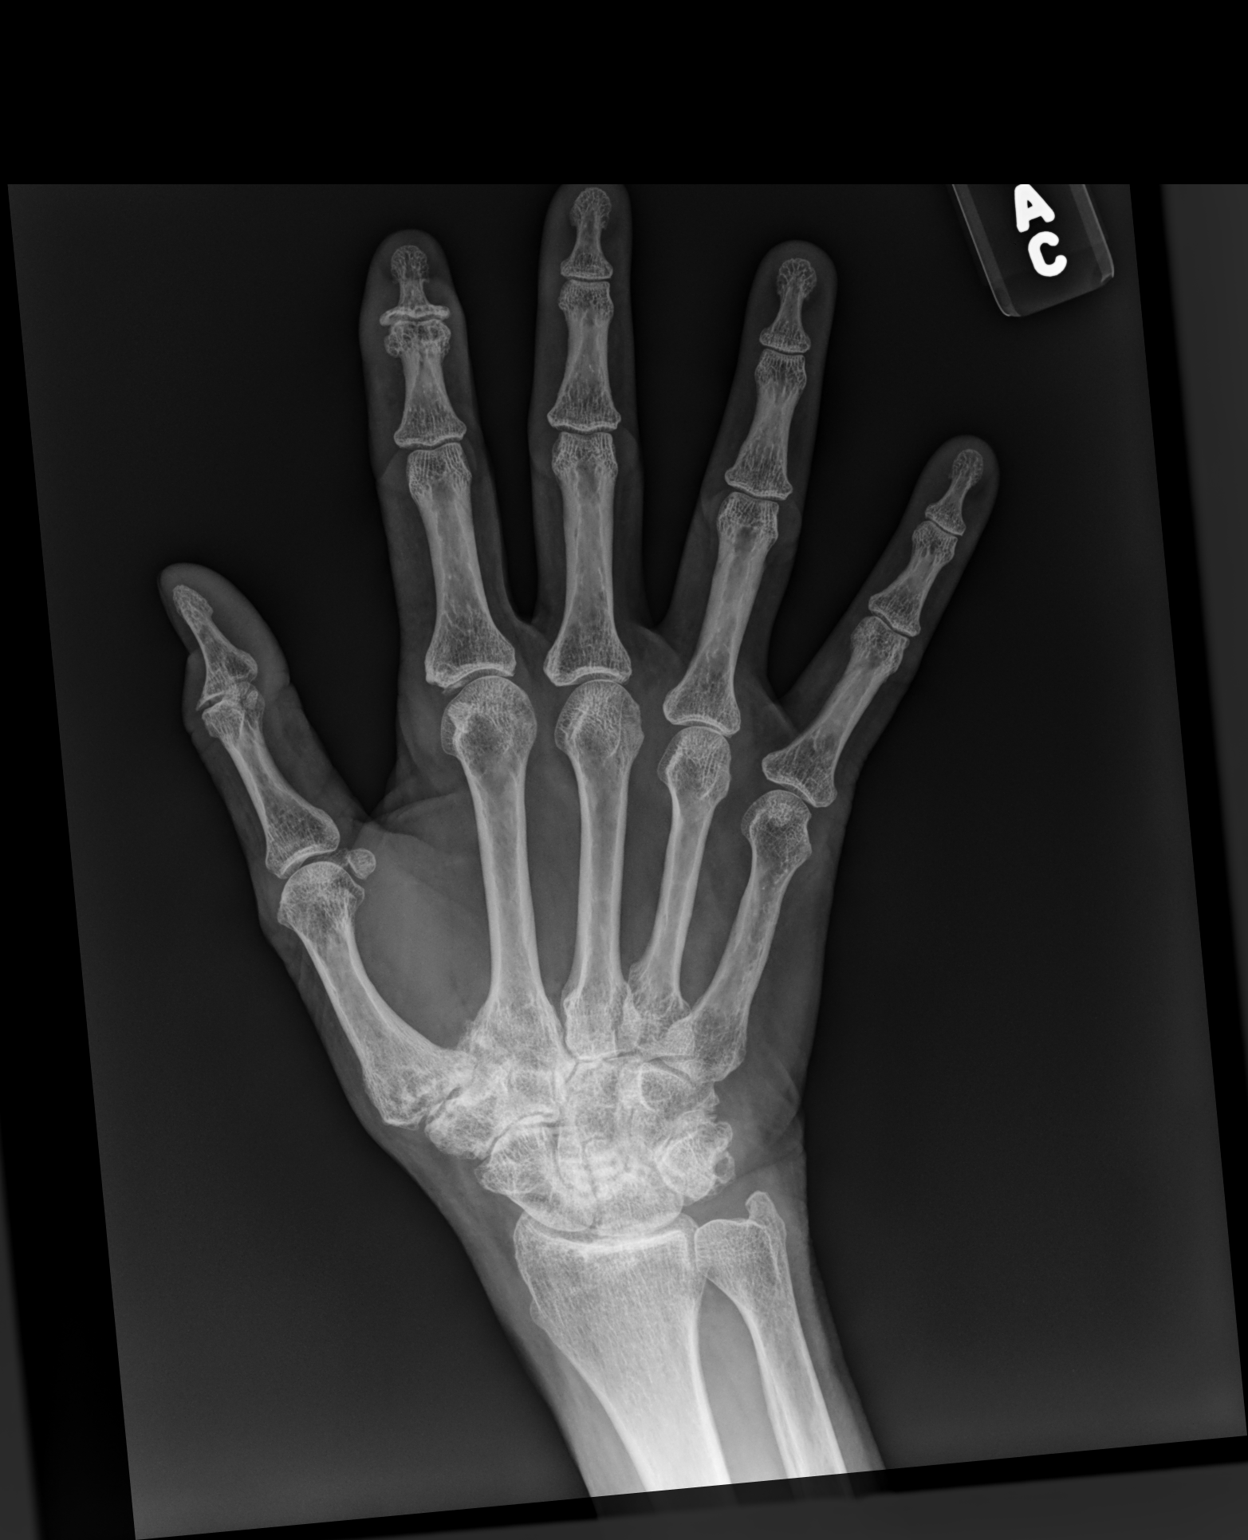

[hand mlo]
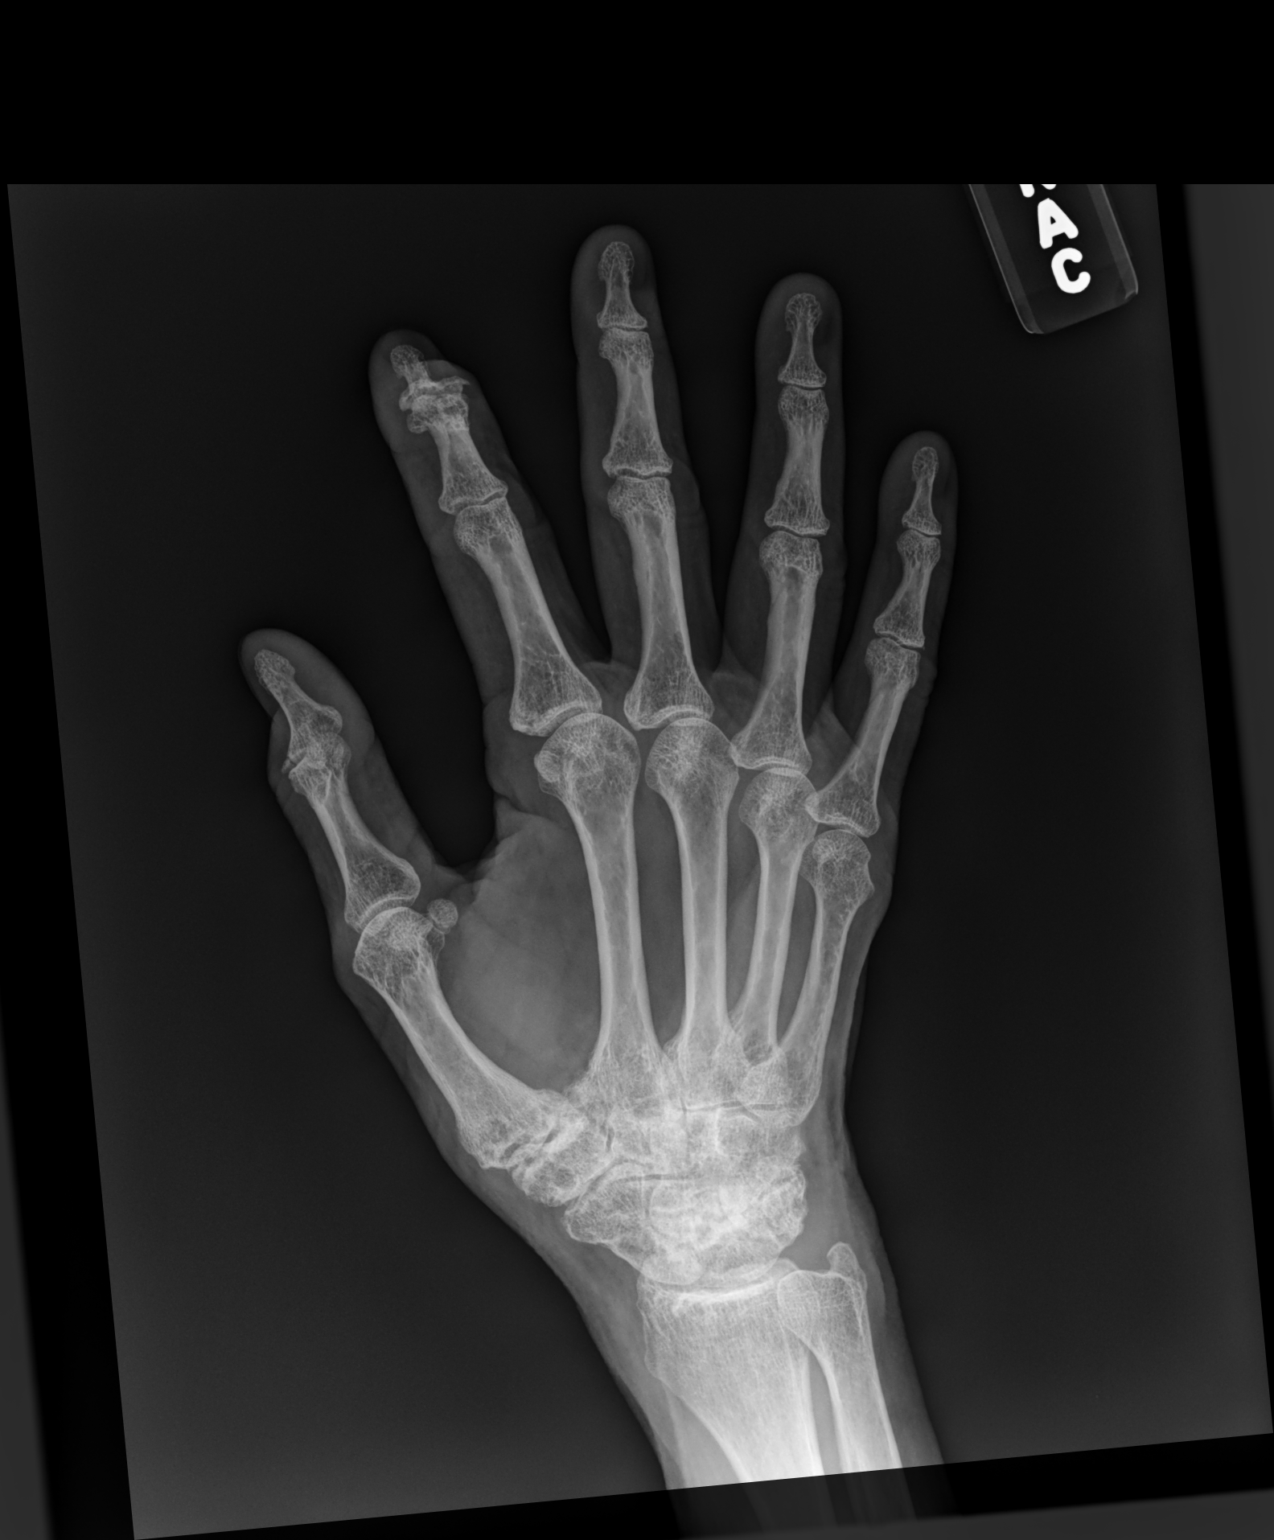

[hand lat]
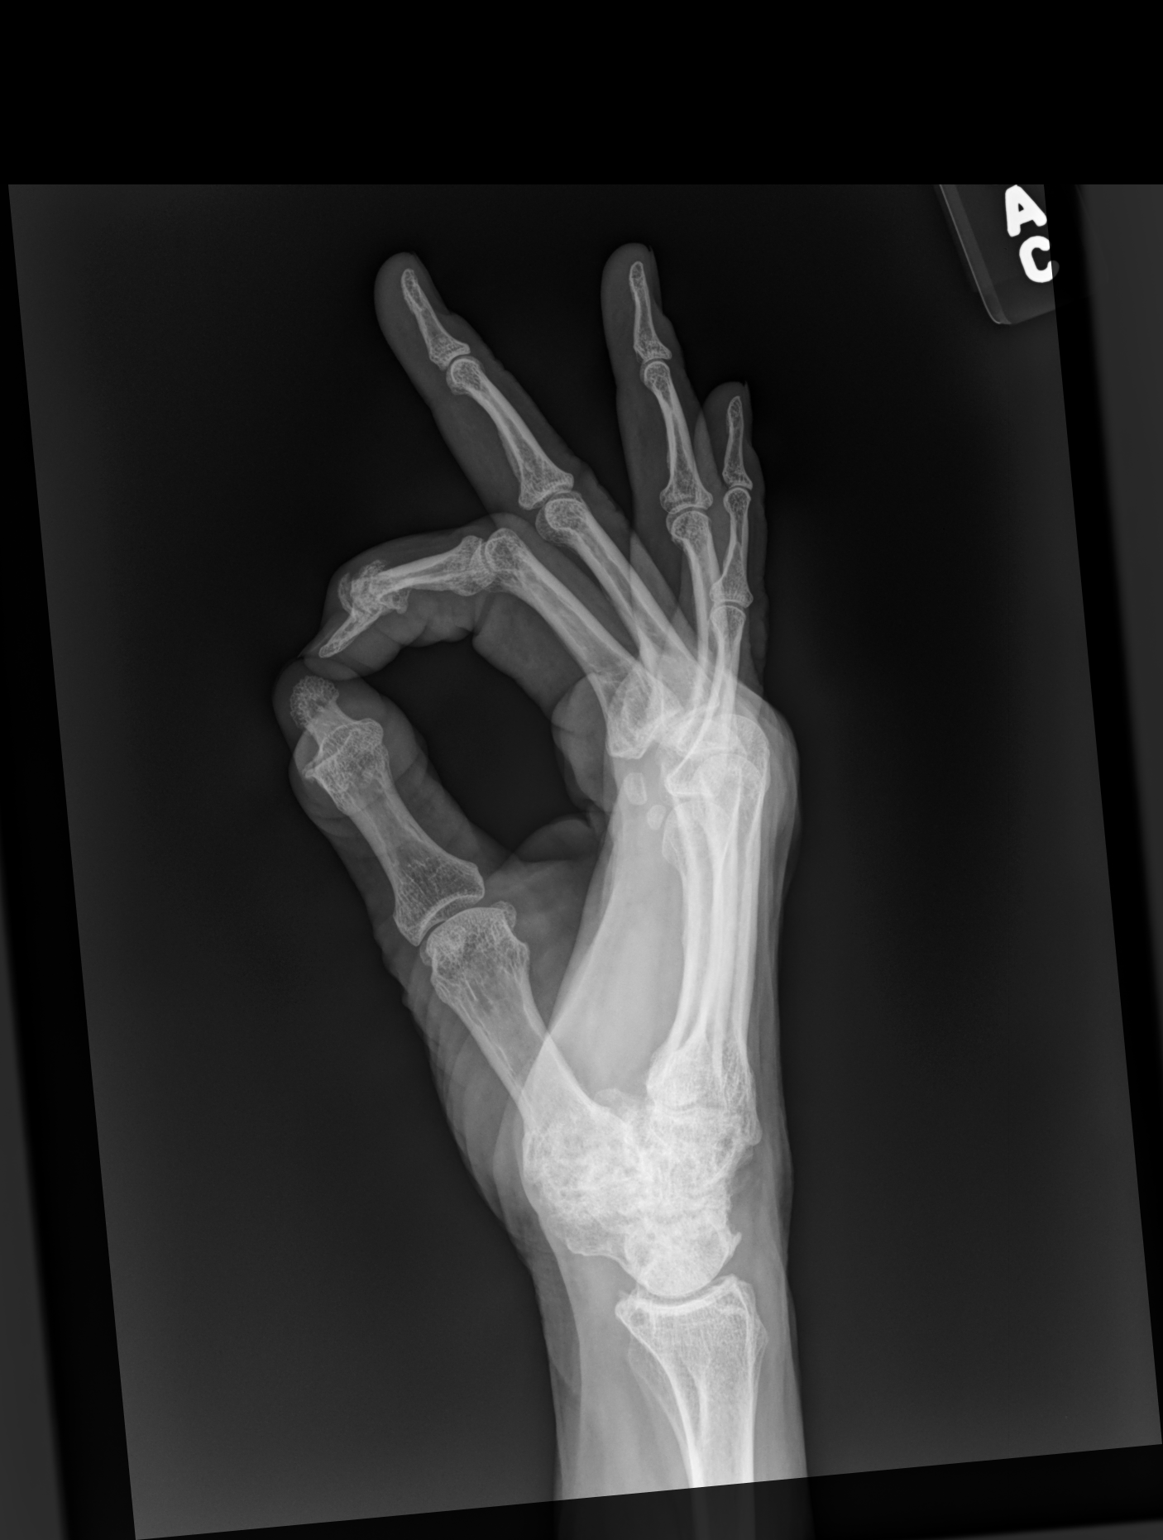

[3 of 3 positions shown; findings below may reference images not displayed]

FINDINGS: Diffuse degenerative changes noted about the right hand and wrist.
Degenerative changes are particularly prominent about the
radiocarpal, intercarpal at, and carpometacarpal joints. Cystic
changes noted within the carpals and in the base of the right first
metacarpal most likely degenerative. [HOSPITAL] induced arthropathy
cannot be excluded. No periarticular erosions are noted. No evidence
of fracture or dislocation. No radiopaque foreign body.
IMPRESSION: 1. Diffuse degenerative change. Degenerative changes particularly
severe about the right wrist. Cystic changes noted about the carpals
and the base of the right first metacarpal most likely degenerative.
[HOSPITAL] induced arthropathy cannot be excluded.

2. No acute bony or joint abnormality identified. No evidence of
acute fracture or dislocation.

## 2022-06-08 NOTE — Assessment & Plan Note (Signed)
Ordering tsh today pending results Continue levothyroxine 75 mcg once daily, do not take with food and or other medication by at least thirty minutes.  

## 2022-06-08 NOTE — Assessment & Plan Note (Signed)
Prolia injection today and then again every 6 months. administered in office Last dexa 01/16/20 Did advise pt to call her GYN

## 2022-06-08 NOTE — Progress Notes (Addendum)
Virtual Visit via Video note   I connected with pt on 06/08/22 at [facility]. The provider, Mort Sawyers, FNP is located in their home at time of visit.   I discussed the limitations, risks, security and privacy concerns of performing an evaluation and management service by video and the availability of in person appointments. I also discussed with the patient that there may be a patient responsible charge related to this service. The patient expressed understanding and agreed to proceed.  Subjective: PCP: Mort Sawyers, FNP  Chief Complaint  Patient presents with   Medical Management of Chronic Issues    HPI  Here for f/u appointment.  Vitamin b12 def: has completed three months of b12 injections and taking otc b12 1000 mcg once daily. Had neuropathy in her hands which has since resolved.   Osteoporosis: on prolia Dexa: recent in 01/2020. She will touch base with GYN and see if they can order it for her since they have done it for her in the past.   Shingles vaccine: states has had completed in the past but doesn't have it documented will look into this and give [place] documentation once she finds out.   ROS: Per HPI  Current Outpatient Medications:    Calcium Carbonate (CALCIUM 600 PO), Take by mouth., Disp: , Rfl:    Cholecalciferol (VITAMIN D3) 50 MCG (2000 UT) TABS, Take 2,000 Int'l Units by mouth daily., Disp: , Rfl:    ciprofloxacin (CIPRO) 500 MG tablet, Take 500 mg by mouth as needed (after sex)., Disp: , Rfl:    Cyanocobalamin (B-12) 1000 MCG CAPS, Take 1,000 mcg by mouth daily., Disp: , Rfl:    denosumab (PROLIA) 60 MG/ML SOLN injection, Inject 60 mg into the skin every 6 (six) months. Administer in upper arm, thigh, or abdomen, Disp: , Rfl:    diclofenac Sodium (VOLTAREN) 1 % GEL, Apply 1 application topically 4 (four) times daily. Prn pain, Disp: , Rfl:    estradiol (VAGIFEM) 25 MCG vaginal tablet, Place 25 mcg vaginally 2 (two) times a week., Disp: ,  Rfl:    Fluticasone Propionate (FLONASE ALLERGY RELIEF NA), Place 1 spray into the nose daily as needed., Disp: , Rfl:    levothyroxine (SYNTHROID) 75 MCG tablet, TAKE 1 TABLET BY MOUTH EVERY DAY IN THE MORNING, Disp: 90 tablet, Rfl: 1   Multiple Vitamin (MULTIVITAMIN) tablet, Take 1 tablet by mouth daily., Disp: , Rfl:    naproxen (NAPROSYN) 500 MG tablet, Take 500 mg by mouth 2 (two) times daily with a meal. Prn pain, Disp: , Rfl:   Observations/Objective: Physical Exam Constitutional:      General: She is not in acute distress.    Appearance: Normal appearance. She is normal weight. She is not ill-appearing, toxic-appearing or diaphoretic.  HENT:     Head: Normocephalic.  Cardiovascular:     Rate and Rhythm: Normal rate.  Pulmonary:     Effort: Pulmonary effort is normal.  Musculoskeletal:        General: Normal range of motion.  Neurological:     General: No focal deficit present.     Mental Status: She is alert and oriented to person, place, and time. Mental status is at baseline.  Psychiatric:        Mood and Affect: Mood normal.        Behavior: Behavior normal.        Thought Content: Thought content normal.        Judgment: Judgment normal.  Assessment and Plan: Acquired hypothyroidism Assessment & Plan: Ordering tsh today pending results Continue levothyroxine 75 mcg once daily, do not take with food and or other medication by at least thirty minutes.   Orders: -     TSH  Vitamin D deficiency -     VITAMIN D 25 Hydroxy (Vit-D Deficiency, Fractures)  Age-related osteoporosis without current pathological fracture Assessment & Plan: Prolia injection to be administered pending labs and insurance authorization  Ordering bmp pending results  Last dexa 01/16/20 Did advise pt to call her GYN to get this scheduled   Orders: -     Basic metabolic panel  Vitamin B12 deficiency Assessment & Plan: Ordered b12 pending results Continue otc supplementation    Orders: -     Vitamin B12    Follow Up Instructions: Return in about 6 months (around 12/08/2022) for f/u CPE.   I discussed the assessment and treatment plan with the patient. The patient was provided an opportunity to ask questions and all were answered. The patient agreed with the plan and demonstrated an understanding of the instructions.   The patient was advised to call back or seek an in-person evaluation if the symptoms worsen or if the condition fails to improve as anticipated.  The above assessment and management plan was discussed with the patient. The patient verbalized understanding of and has agreed to the management plan. Patient is aware to call the clinic if symptoms persist or worsen. Patient is aware when to return to the clinic for a follow-up visit. Patient educated on when it is appropriate to go to the emergency department.       Mort Sawyers, MSN, APRN, FNP-C [facility]

## 2022-06-09 NOTE — Assessment & Plan Note (Signed)
Ordered b12 pending results Continue otc supplementation  

## 2022-06-15 ENCOUNTER — Ambulatory Visit: Payer: Medicare HMO | Admitting: *Deleted

## 2022-06-15 DIAGNOSIS — M810 Age-related osteoporosis without current pathological fracture: Secondary | ICD-10-CM | POA: Diagnosis not present

## 2022-06-15 MED ORDER — DENOSUMAB 60 MG/ML ~~LOC~~ SOSY
60.0000 mg | PREFILLED_SYRINGE | Freq: Once | SUBCUTANEOUS | Status: AC
Start: 2022-06-15 — End: 2022-06-15
  Administered 2022-06-15: 60 mg via SUBCUTANEOUS

## 2022-06-15 NOTE — Progress Notes (Signed)
Per orders of Mort Sawyers FNP, injection of Prolia given by Sydell Axon. Patient tolerated injection well.

## 2022-10-14 ENCOUNTER — Encounter: Payer: Self-pay | Admitting: Family

## 2022-10-22 ENCOUNTER — Other Ambulatory Visit: Payer: Self-pay | Admitting: Family

## 2022-10-22 DIAGNOSIS — E039 Hypothyroidism, unspecified: Secondary | ICD-10-CM

## 2022-12-14 ENCOUNTER — Encounter: Payer: Self-pay | Admitting: Family

## 2022-12-14 ENCOUNTER — Ambulatory Visit (INDEPENDENT_AMBULATORY_CARE_PROVIDER_SITE_OTHER): Payer: Medicare HMO | Admitting: Family

## 2022-12-14 VITALS — BP 128/88 | HR 78 | Temp 98.0°F | Ht 67.0 in | Wt 144.3 lb

## 2022-12-14 DIAGNOSIS — E7800 Pure hypercholesterolemia, unspecified: Secondary | ICD-10-CM

## 2022-12-14 DIAGNOSIS — E039 Hypothyroidism, unspecified: Secondary | ICD-10-CM | POA: Diagnosis not present

## 2022-12-14 DIAGNOSIS — Z0001 Encounter for general adult medical examination with abnormal findings: Secondary | ICD-10-CM

## 2022-12-14 DIAGNOSIS — R7303 Prediabetes: Secondary | ICD-10-CM | POA: Diagnosis not present

## 2022-12-14 DIAGNOSIS — Z0000 Encounter for general adult medical examination without abnormal findings: Secondary | ICD-10-CM

## 2022-12-14 DIAGNOSIS — M810 Age-related osteoporosis without current pathological fracture: Secondary | ICD-10-CM

## 2022-12-14 DIAGNOSIS — E538 Deficiency of other specified B group vitamins: Secondary | ICD-10-CM

## 2022-12-14 DIAGNOSIS — R011 Cardiac murmur, unspecified: Secondary | ICD-10-CM

## 2022-12-14 DIAGNOSIS — J0100 Acute maxillary sinusitis, unspecified: Secondary | ICD-10-CM

## 2022-12-14 DIAGNOSIS — E559 Vitamin D deficiency, unspecified: Secondary | ICD-10-CM

## 2022-12-14 DIAGNOSIS — J302 Other seasonal allergic rhinitis: Secondary | ICD-10-CM | POA: Diagnosis not present

## 2022-12-14 LAB — BASIC METABOLIC PANEL
BUN: 16 mg/dL (ref 6–23)
CO2: 27 meq/L (ref 19–32)
Calcium: 9.8 mg/dL (ref 8.4–10.5)
Chloride: 104 meq/L (ref 96–112)
Creatinine, Ser: 0.75 mg/dL (ref 0.40–1.20)
GFR: 81.71 mL/min (ref >60.00)
Glucose, Bld: 97 mg/dL (ref 70–99)
Potassium: 5 meq/L (ref 3.5–5.1)
Sodium: 139 meq/L (ref 135–145)

## 2022-12-14 LAB — LIPID PANEL
Cholesterol: 196 mg/dL (ref 0–200)
HDL: 108.4 mg/dL (ref >39.00)
LDL Cholesterol: 64 mg/dL (ref 0–99)
NonHDL: 87.73
Total CHOL/HDL Ratio: 2
Triglycerides: 118 mg/dL (ref 0.0–149.0)
VLDL: 23.6 mg/dL (ref 0.0–40.0)

## 2022-12-14 LAB — HEMOGLOBIN A1C: Hgb A1c MFr Bld: 5.9 % (ref 4.6–6.5)

## 2022-12-14 LAB — VITAMIN B12: Vitamin B-12: 616 pg/mL (ref 211–911)

## 2022-12-14 LAB — TSH: TSH: 1.64 u[IU]/mL (ref 0.35–5.50)

## 2022-12-14 LAB — VITAMIN D 25 HYDROXY (VIT D DEFICIENCY, FRACTURES): VITD: 28.39 ng/mL — ABNORMAL LOW (ref 30.00–100.00)

## 2022-12-14 MED ORDER — AMOXICILLIN-POT CLAVULANATE 875-125 MG PO TABS
1.0000 | ORAL_TABLET | Freq: Two times a day (BID) | ORAL | 0 refills | Status: AC
Start: 2022-12-14 — End: 2022-12-21

## 2022-12-14 NOTE — Assessment & Plan Note (Signed)
Prolia injection to be administered pending labs Ordering bmp and vitamin D pending results  Last dexa 01/16/20, pt states her GYN will get this ordered.

## 2022-12-14 NOTE — Assessment & Plan Note (Signed)
Recommendation to start xyzal

## 2022-12-14 NOTE — Assessment & Plan Note (Signed)

## 2022-12-14 NOTE — Progress Notes (Signed)
Subjective:  Patient ID: Amber Soto, female    DOB: May 16, 1955  Age: 67 y.o. MRN: [id number]  Patient Care Team: Mort Sawyers, FNP as PCP - General (Family Medicine) Jake Bathe, MD as PCP - Cardiology (Cardiology)   CC:  Chief Complaint  Patient presents with   Annual Exam    Wants to discuss RSV and Shingles vaccines.    HPI Amber Soto is a 67 y.o. female who presents today for an annual physical exam. She reports consuming a general diet. The patient does not participate in regular exercise at present. She generally feels well. She reports sleeping well. She does have additional problems to discuss today.   Vision:Within last year Dental:Receives regular dental care  Mammogram: 02/22/22 Last pap: > 65 y/o  Colonoscopy: 10/24/15 Q10y Bone density scan: 01/16/20, overdue  Pt is with acute concerns.  Osteoporosis, on prolia, due for 6 M next week. Wants to get her blood work done today.   Sinus congestion, started a few weeks ago. Stopped her allergy medication because was thinking it was drying her out too much. With nasal congestion especially when blowing nose in the am with yellow rhinorrhea. No sore throat or ear pain. Some slight sinus pressure. No fever. Does take tylenol or ibuprofen prn with some relief. Covid tested and was negative. Cough mainly first thing in the am.   Hypothyroid: on levothyroxine 75 mcg once daily. Lab Results  Component Value Date   TSH 1.72 06/08/2022     Advanced Directives Patient does have advanced directives including living will. She does not have a copy in the electronic medical record.   DEPRESSION SCREENING    12/14/2022   11:56 AM 06/08/2022   12:33 PM 03/15/2022    3:36 PM 07/21/2021    9:49 AM 01/20/2021   10:38 AM 01/14/2020    9:30 AM 01/08/2019   10:55 AM  PHQ 2/9 Scores  PHQ - 2 Score 0 0 0 0 0 0 0  PHQ- 9 Score 0   4  0      ROS: Negative unless specifically indicated above in HPI.    Current Outpatient  Medications:    amoxicillin-clavulanate (AUGMENTIN) 875-125 MG tablet, Take 1 tablet by mouth 2 (two) times daily for 7 days., Disp: 14 tablet, Rfl: 0   Calcium Carbonate (CALCIUM 600 PO), Take by mouth., Disp: , Rfl:    Cholecalciferol (VITAMIN D3) 50 MCG (2000 UT) TABS, Take 2,000 Int'l Units by mouth daily., Disp: , Rfl:    ciprofloxacin (CIPRO) 500 MG tablet, Take 500 mg by mouth as needed (after sex)., Disp: , Rfl:    Cyanocobalamin (B-12) 1000 MCG CAPS, Take 1,000 mcg by mouth daily., Disp: , Rfl:    denosumab (PROLIA) 60 MG/ML SOLN injection, Inject 60 mg into the skin every 6 (six) months. Administer in upper arm, thigh, or abdomen, Disp: , Rfl:    diclofenac Sodium (VOLTAREN) 1 % GEL, Apply 1 application topically 4 (four) times daily. Prn pain, Disp: , Rfl:    estradiol (VAGIFEM) 25 MCG vaginal tablet, Place 25 mcg vaginally 2 (two) times a week., Disp: , Rfl:    Fluticasone Propionate (FLONASE ALLERGY RELIEF NA), Place 1 spray into the nose daily as needed., Disp: , Rfl:    levothyroxine (SYNTHROID) 75 MCG tablet, TAKE 1 TABLET BY MOUTH EVERY DAY IN THE MORNING, Disp: 90 tablet, Rfl: 1   Multiple Vitamin (MULTIVITAMIN) tablet, Take 1 tablet by mouth daily., Disp: , Rfl:  naproxen (NAPROSYN) 500 MG tablet, Take 500 mg by mouth 2 (two) times daily with a meal. Prn pain, Disp: , Rfl:     Objective:    BP 128/88 (BP Location: Left Arm, Patient Position: Sitting, Cuff Size: Normal)   Pulse 78   Temp 98 F (36.7 C) (Temporal)   Ht 5\' 7"  (1.702 m)   Wt 144 lb 4.8 oz (65.5 kg)   SpO2 98%   BMI 22.60 kg/m   BP Readings from Last 3 Encounters:  12/14/22 128/88  06/08/22 120/72  11/04/21 124/64      Physical Exam Constitutional:      General: She is not in acute distress.    Appearance: Normal appearance. She is normal weight. She is not ill-appearing.  HENT:     Head: Normocephalic.     Right Ear: Tympanic membrane normal.     Left Ear: Tympanic membrane normal.     Nose:  Nose normal.     Mouth/Throat:     Mouth: Mucous membranes are moist.  Eyes:     Extraocular Movements: Extraocular movements intact.     Pupils: Pupils are equal, round, and reactive to light.  Cardiovascular:     Rate and Rhythm: Normal rate and regular rhythm.     Heart sounds: Murmur heard.  Pulmonary:     Effort: Pulmonary effort is normal.     Breath sounds: Normal breath sounds.  Abdominal:     General: Abdomen is flat. Bowel sounds are normal.     Palpations: Abdomen is soft.     Tenderness: There is no guarding or rebound.  Musculoskeletal:        General: Normal range of motion.     Cervical back: Normal range of motion.     Right lower leg: No edema.     Left lower leg: No edema.  Skin:    General: Skin is warm.     Capillary Refill: Capillary refill takes less than 2 seconds.  Neurological:     General: No focal deficit present.     Mental Status: She is alert.  Psychiatric:        Mood and Affect: Mood normal.        Behavior: Behavior normal.        Thought Content: Thought content normal.        Judgment: Judgment normal.          Assessment & Plan:  Encounter for general adult medical examination with abnormal findings Assessment & Plan: Patient Counseling(The following topics were reviewed):  Preventative care handout given to pt  Health maintenance and immunizations reviewed. Please refer to Health maintenance section. Pt advised on safe sex, wearing seatbelts in car, and proper nutrition labwork ordered today for annual Dental health: Discussed importance of regular tooth brushing, flossing, and dental visits.    Seasonal allergic rhinitis, unspecified trigger Assessment & Plan: Recommendation to start xyzal  Orders: -     Amoxicillin-Pot Clavulanate; Take 1 tablet by mouth 2 (two) times daily for 7 days.  Dispense: 14 tablet; Refill: 0  Hypercholesteremia -     Lipid panel  Vitamin D deficiency -     VITAMIN D 25 Hydroxy (Vit-D  Deficiency, Fractures)  Vitamin B12 deficiency -     Vitamin B12  Acquired hypothyroidism Assessment & Plan: Ordering tsh today pending results Continue levothyroxine 75 mcg once daily, do not take with food and or other medication by at least thirty minutes.   Orders: -  TSH  Prediabetes -     Hemoglobin A1c -     Basic metabolic panel  Heart murmur  Age-related osteoporosis without current pathological fracture Assessment & Plan: Prolia injection to be administered pending labs Ordering bmp and vitamin D pending results  Last dexa 01/16/20, pt states her GYN will get this ordered.     Acute non-recurrent maxillary sinusitis Assessment & Plan: Prescription given for augmentin 875/125 mg po bid for ten days. Pt to continue tylenol/ibuprofen prn sinus pain. Continue with humidifier prn and steam showers recommended as well. instructed If no symptom improvement in 48 hours please f/u        Follow-up: Return in about 1 year (around 12/14/2023) for f/u CPE.   Mort Sawyers, FNP

## 2022-12-14 NOTE — Assessment & Plan Note (Signed)
Ordering tsh today pending results Continue levothyroxine 75 mcg once daily, do not take with food and or other medication by at least thirty minutes.  

## 2022-12-14 NOTE — Assessment & Plan Note (Signed)
Prescription given for augmentin 875/125 mg po bid for ten days. Pt to continue tylenol/ibuprofen prn sinus pain. Continue with humidifier prn and steam showers recommended as well. instructed If no symptom improvement in 48 hours please f/u ? ?

## 2022-12-14 NOTE — Patient Instructions (Signed)
  Recommendation for bone density and also mammogram in January . Ask your OBGYN   Recommendation for shingles and RSV vaccination to obtain at the pharmacy.

## 2022-12-15 ENCOUNTER — Other Ambulatory Visit: Payer: Self-pay | Admitting: Family

## 2022-12-15 DIAGNOSIS — E559 Vitamin D deficiency, unspecified: Secondary | ICD-10-CM

## 2022-12-15 MED ORDER — CHOLECALCIFEROL 1.25 MG (50000 UT) PO TABS: 1.0000 | ORAL_TABLET | ORAL | 0 refills | Status: AC

## 2022-12-17 ENCOUNTER — Encounter: Payer: Self-pay | Admitting: Family

## 2022-12-17 ENCOUNTER — Telehealth: Payer: Self-pay

## 2022-12-17 NOTE — Telephone Encounter (Signed)
Looks like labs are done and resulted but not sure about any of the other processes for Prolia.

## 2022-12-17 NOTE — Telephone Encounter (Signed)
 Prolia VOB initiated via [email or web address]  Next Prolia inj DUE: NOW

## 2022-12-18 ENCOUNTER — Encounter: Payer: Self-pay | Admitting: Family

## 2022-12-18 DIAGNOSIS — B3731 Acute candidiasis of vulva and vagina: Secondary | ICD-10-CM

## 2022-12-20 ENCOUNTER — Other Ambulatory Visit (HOSPITAL_COMMUNITY): Payer: Self-pay

## 2022-12-20 MED ORDER — FLUCONAZOLE 150 MG PO TABS: 150.0000 mg | ORAL_TABLET | Freq: Once | ORAL | 0 refills | Status: AC

## 2022-12-20 NOTE — Telephone Encounter (Signed)
Pt ready for scheduling for Prolia on or after : 12/20/22  Out-of-pocket cost due at time of visit: $20  Primary: [employer] Prolia co-insurance: $20 Admin fee co-insurance: 0%  Secondary: --- Prolia co-insurance:  Admin fee co-insurance:   Medical Benefit Details: Date Benefits were checked: 12/17/22 Deductible: NO/ Coinsurance: $20/ Admin Fee: 0%  Prior Auth: APPROVED PA# [id number] Expiration Date: 05/06/22-05/06/23  # of doses approved:2  Pharmacy benefit: Copay $60  If patient wants fill through the pharmacy benefit please send prescription to: [employer], and include estimated need by date in rx notes. Pharmacy will ship medication directly to the office.  Patient NOT eligible for Prolia Copay Card. Copay Card can make patient's cost as little as $25. Link to apply: [email or web address]://[email or web address]  ** This summary of benefits is an estimation of the patient's out-of-pocket cost. Exact cost may very based on individual plan coverage.

## 2022-12-20 NOTE — Telephone Encounter (Signed)
Appointment scheduled with patient

## 2022-12-21 NOTE — Telephone Encounter (Signed)
Patient has been scheduled and aware of cost.

## 2022-12-22 ENCOUNTER — Ambulatory Visit: Payer: Medicare HMO

## 2022-12-22 DIAGNOSIS — M810 Age-related osteoporosis without current pathological fracture: Secondary | ICD-10-CM | POA: Diagnosis not present

## 2022-12-22 MED ORDER — DENOSUMAB 60 MG/ML ~~LOC~~ SOSY: 60.0000 mg | PREFILLED_SYRINGE | Freq: Once | SUBCUTANEOUS | Status: AC

## 2022-12-22 MED ORDER — DENOSUMAB 60 MG/ML ~~LOC~~ SOSY
60.0000 mg | PREFILLED_SYRINGE | Freq: Once | SUBCUTANEOUS | Status: AC
  Administered 2022-12-22: 60 mg via SUBCUTANEOUS

## 2022-12-22 NOTE — Progress Notes (Signed)
Per orders of Mort Sawyers, NP, injection of prolia 60 mg [place] given by Lewanda Rife in left arm. Patient tolerated injection well. Patient will make appointment for 6 month.

## 2022-12-22 NOTE — Addendum Note (Signed)
Addended by: Patience Musca on: 12/22/2022 04:09 PM   Modules accepted: Orders

## 2023-02-08 ENCOUNTER — Other Ambulatory Visit: Payer: Self-pay | Admitting: Family

## 2023-02-08 DIAGNOSIS — E559 Vitamin D deficiency, unspecified: Secondary | ICD-10-CM

## 2023-04-21 ENCOUNTER — Other Ambulatory Visit: Payer: Self-pay | Admitting: Family

## 2023-04-21 DIAGNOSIS — E039 Hypothyroidism, unspecified: Secondary | ICD-10-CM

## 2023-04-22 ENCOUNTER — Telehealth: Payer: Self-pay | Admitting: Family

## 2023-04-22 DIAGNOSIS — Z780 Asymptomatic menopausal state: Secondary | ICD-10-CM

## 2023-04-22 NOTE — Telephone Encounter (Signed)
 Per our records, pt is due for her mammogram. LM for pt to return call.

## 2023-05-09 NOTE — Addendum Note (Signed)
 Addended by: Mort Sawyers on: 05/09/2023 09:21 PM   Modules accepted: Orders

## 2023-06-14 ENCOUNTER — Ambulatory Visit
Admission: RE | Admit: 2023-06-14 | Discharge: 2023-06-14 | Disposition: A | Discharge status: Home / Self Care | Source: Ambulatory Visit | Attending: Family | Admitting: Family

## 2023-06-14 ENCOUNTER — Encounter: Payer: Self-pay | Admitting: Family

## 2023-06-14 DIAGNOSIS — Z780 Asymptomatic menopausal state: Secondary | ICD-10-CM | POA: Diagnosis present

## 2023-06-22 ENCOUNTER — Encounter (HOSPITAL_COMMUNITY): Payer: Self-pay

## 2023-06-29 ENCOUNTER — Ambulatory Visit: Payer: Medicare HMO

## 2023-06-29 VITALS — Ht 67.0 in | Wt 144.0 lb

## 2023-06-29 DIAGNOSIS — Z0000 Encounter for general adult medical examination without abnormal findings: Secondary | ICD-10-CM

## 2023-06-29 NOTE — Progress Notes (Signed)
 Please attest and cosign this visit due to patients primary care provider not being in the office at the time the visit was completed.    Subjective:   Amber Soto is a 68 y.o. who presents for a Medicare Wellness preventive visit.  As a reminder, Annual Wellness Visits don't include a physical exam, and some assessments may be limited, especially if this visit is performed virtually. We may recommend an in-person visit if needed.  Visit Complete: Virtual I connected with  Amber Soto on 06/29/23 by a audio enabled telemedicine application and verified that I am speaking with the correct person using two identifiers.  Patient Location: Home  Provider Location: Office/Clinic  I discussed the limitations of evaluation and management by telemedicine. The patient expressed understanding and agreed to proceed.  Vital Signs: Because this visit was a virtual/telehealth visit, some criteria may be missing or patient reported. Any vitals not documented were not able to be obtained and vitals that have been documented are patient reported.  VideoDeclined- This patient declined Librarian, academic. Therefore the visit was completed with audio only.  Persons Participating in Visit: Patient.  AWV Questionnaire: Yes: Patient Medicare AWV questionnaire was completed by the patient on 06/28/23; I have confirmed that all information answered by patient is correct and no changes since this date.  Cardiac Risk Factors include: advanced age (>49men, >13 women)     Objective:     Today's Vitals   06/29/23 1114  Weight: 144 lb (65.3 kg)  Height: 5\' 7"  (1.702 m)   Body mass index is 22.55 kg/m.     06/29/2023   11:19 AM 03/15/2022    3:37 PM 10/10/2015    2:25 PM  Advanced Directives  Does Patient Have a Medical Advance Directive? Yes Yes No  Type of Estate agent of [place];Living will Healthcare Power of [place];Living will   Copy of  Healthcare Power of Attorney in Chart? No - copy requested No - copy requested   Would patient like information on creating a medical advance directive?   No - patient declined information    Current Medications (verified) Outpatient Encounter Medications as of 06/29/2023  Medication Sig   Calcium Carbonate (CALCIUM 600 PO) Take by mouth.   Cholecalciferol  (VITAMIN D3) 50 MCG (2000 UT) TABS Take 2,000 Int'l Units by mouth daily.   Cholecalciferol  1.25 MG (50000 UT) TABS Take 1 tablet by mouth once a week.   ciprofloxacin (CIPRO) 500 MG tablet Take 500 mg by mouth as needed (after sex).   Cyanocobalamin  (B-12) 1000 MCG CAPS Take 1,000 mcg by mouth daily.   denosumab  (PROLIA ) 60 MG/ML SOLN injection Inject 60 mg into the skin every 6 (six) months. Administer in upper arm, thigh, or abdomen   diclofenac  Sodium (VOLTAREN ) 1 % GEL Apply 1 application topically 4 (four) times daily. Prn pain   estradiol (VAGIFEM) 25 MCG vaginal tablet Place 25 mcg vaginally 2 (two) times a week.   Fluticasone Propionate (FLONASE ALLERGY RELIEF NA) Place 1 spray into the nose daily as needed.   levothyroxine  (SYNTHROID ) 75 MCG tablet TAKE 1 TABLET BY MOUTH EVERY DAY IN THE MORNING   Multiple Vitamin (MULTIVITAMIN) tablet Take 1 tablet by mouth daily.   naproxen (NAPROSYN) 500 MG tablet Take 500 mg by mouth 2 (two) times daily with a meal. Prn pain   Facility-Administered Encounter Medications as of 06/29/2023  Medication   denosumab  (PROLIA ) injection 60 mg    Allergies (verified) Macrobid [nitrofurantoin  monohyd macro]   History: Past Medical History:  Diagnosis Date   ALLERGIC RHINITIS 01/30/2007   Allergy    ANEMIA-NOS 01/30/2007   Arthritis 2014   Botth wrists and hands   CAROTID BRUIT, RIGHT 01/30/2007   DIVERTICULUM, BLADDER 01/30/2007   Heart murmur 1976   HEART MURMUR, HX OF 01/30/2007   Unspecified hypothyroidism 02/25/2009   UTI'S, HX OF 01/30/2007   Past Surgical History:  Procedure  Laterality Date   COLONOSCOPY     10 yrs ago- normal   NO PAST SURGERIES     Family History  Problem Relation Age of Onset   Hypertension Mother    Thyroid  disease Mother        hypothyroid   Alcohol abuse Mother    Stroke Mother    Alcohol abuse Father        in remission prior to death   Hearing loss Father    Arthritis Brother    Depression Brother    Colon polyps Brother    Cancer Maternal Aunt        bone cancer   Arthritis Maternal Aunt        RA   Diabetes Other    Diabetes Paternal Grandfather    Colon cancer Neg Hx    Social History   Socioeconomic History   Marital status: Married    Spouse name: Not on file   Number of children: 2   Years of education: 16   Highest education level: Bachelor's degree (e.g., BA, AB, BS)  Occupational History   Occupation: Fish farm manager  Tobacco Use   Smoking status: Never   Smokeless tobacco: Never  Vaping Use   Vaping status: Never Used  Substance and Sexual Activity   Alcohol use: Yes    Alcohol/week: 10.0 standard drinks of alcohol    Types: 10 Glasses of wine per week    Comment: 1 a night   Drug use: Yes    Frequency: 1.0 times per week    Types: Marijuana    Comment: rarely   Sexual activity: Yes    Partners: Male    Birth control/protection: Post-menopausal  Other Topics Concern   Not on file  Social History Narrative   [employer]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 Married 1979. 1 daughter 1985-married RN; 1 son 70 [place]-graduated, works [place]-married '14. 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 SO in good health   Social Drivers of Health   Financial Resource Strain: Low Risk  (06/28/2023)   Overall Financial Resource Strain (CARDIA)    Difficulty of Paying Living Expenses: Not hard at all  Food Insecurity: No Food Insecurity (06/28/2023)   Hunger Vital Sign    Worried About Running Out of Food in the Last Year: Never true    Ran Out of Food in the Last Year: Never true   Transportation Needs: No Transportation Needs (06/28/2023)   PRAPARE - Administrator, Civil Service (Medical): No    Lack of Transportation (Non-Medical): No  Physical Activity: Insufficiently Active (06/28/2023)   Exercise Vital Sign    Days of Exercise per Week: 2 days    Minutes of Exercise per Session: 30 min  Stress: No Stress Concern Present (06/28/2023)   [employer] of Occupational Health - Occupational Stress Questionnaire    Feeling of Stress : Only a little  Social Connections: Socially Integrated (06/28/2023)   Social Connection and Isolation Panel [NHANES]    Frequency of Communication with Friends and  Family: More than three times a week    Frequency of Social Gatherings with Friends and Family: Once a week    Attends Religious Services: More than 4 times per year    Active Member of [employer] or Organizations: Yes    Attends Engineer, structural: More than 4 times per year    Marital Status: Married    Tobacco Counseling Counseling given: Not Answered    Clinical Intake:  Pre-visit preparation completed: Yes  Pain : No/denies pain     BMI - recorded: 22.55 Nutritional Status: BMI of 19-24  Normal Nutritional Risks: None Diabetes: No  Lab Results  Component Value Date   HGBA1C 5.9 12/14/2022   HGBA1C 5.7 01/21/2021     How often do you need to have someone help you when you read instructions, pamphlets, or other written materials from your doctor or pharmacy?: 1 - Never  Interpreter Needed?: No  Information entered by :: B.Mayrene Bastarache,LPN   Activities of Daily Living     06/29/2023   11:20 AM  In your present state of health, do you have any difficulty performing the following activities:  Hearing? 0  Vision? 0  Difficulty concentrating or making decisions? 0  Walking or climbing stairs? 0  Dressing or bathing? 0  Doing errands, shopping? 0  Preparing Food and eating ? N  Using the Toilet? N  In the past six months, have  you accidently leaked urine? N  Do you have problems with loss of bowel control? N  Managing your Medications? N  Managing your Finances? N  Housekeeping or managing your Housekeeping? N    Patient Care Team: Felicita Horns, FNP as PCP - General (Family Medicine) Hugh Madura, MD as PCP - Cardiology (Cardiology)  Indicate any recent Medical Services you may have received from other than [facility] providers in the past year (date may be approximate).     Assessment:    This is a routine wellness examination for Amber Soto.  Hearing/Vision screen Hearing Screening - Comments:: Pt says hearing is good just some ringing in ears Vision Screening - Comments:: Pt says her vision is good w/glasses [facility]   Goals Addressed             This Visit's Progress    Patient Stated       06/29/23-Get a little more exercise        Depression Screen     06/29/2023   11:18 AM 12/14/2022   11:56 AM 06/08/2022   12:33 PM 03/15/2022    3:36 PM 07/21/2021    9:49 AM 01/20/2021   10:38 AM 01/14/2020    9:30 AM  PHQ 2/9 Scores  PHQ - 2 Score 0 0 0 0 0 0 0  PHQ- 9 Score  0   4  0    Fall Risk     06/29/2023   11:16 AM 12/14/2022   11:56 AM 06/08/2022   12:33 PM 03/15/2022    3:38 PM 01/20/2021   10:39 AM  Fall Risk   Falls in the past year? 0 0 0 0 0  Number falls in past yr: 0 0 0 0 0  Injury with Fall? 0 0 0 0 0  Risk for fall due to : No Fall Risks No Fall Risks  Impaired vision   Follow up Education provided;Falls prevention discussed Falls evaluation completed Falls evaluation completed;Education provided;Falls prevention discussed Falls prevention discussed     MEDICARE RISK AT HOME:  Medicare  Risk at Home Any stairs in or around the home?: Yes If so, are there any without handrails?: Yes Home free of loose throw rugs in walkways, pet beds, electrical cords, etc?: Yes Adequate lighting in your home to reduce risk of falls?: Yes Life alert?: No Use of a cane, walker or w/c?:  No Grab bars in the bathroom?: No Shower chair or bench in shower?: No Elevated toilet seat or a handicapped toilet?: No  TIMED UP AND GO:  Was the test performed?  No  Cognitive Function: 6CIT completed        06/29/2023   11:21 AM 03/15/2022    3:38 PM  6CIT Screen  What Year? 0 points 0 points  What month? 0 points 0 points  What time? 0 points 0 points  Count back from 20 0 points 0 points  Months in reverse 0 points 0 points  Repeat phrase 0 points 0 points  Total Score 0 points 0 points    Immunizations Immunization History  Administered Date(s) Administered   Influenza, Seasonal, Injecte, Preservative Fre 11/25/2016   Influenza,inj,Quad PF,6+ Mos 11/22/2013, 10/31/2014, 11/20/2015, 11/25/2016, 12/15/2017, 11/26/2019   Influenza-Unspecified 11/18/2020, 11/15/2021, 11/29/2022   Moderna Sars-Covid-2 Vaccination 03/21/2019, 04/18/2019, 12/18/2019   PFIZER(Purple Top)SARS-COV-2 Vaccination 10/13/2022   Pneumococcal Conjugate-13 01/14/2020   Pneumococcal Polysaccharide-23 01/20/2021   Td 01/08/2019, 06/24/2020   Zoster Recombinant(Shingrix) 03/14/2023, 06/14/2023   Zoster, Live 11/20/2015    Screening Tests Health Maintenance  Topic Date Due   COVID-19 Vaccine (5 - 2024-25 season) 12/08/2022   INFLUENZA VACCINE  09/16/2023   MAMMOGRAM  02/23/2024   Medicare Annual Wellness (AWV)  06/28/2024   Colonoscopy  10/23/2025   DEXA SCAN  06/13/2028   DTaP/Tdap/Td (3 - Tdap) 06/25/2030   Pneumonia Vaccine 3+ Years old  Completed   Hepatitis C Screening  Completed   Zoster Vaccines- Shingrix  Completed   HPV VACCINES  Aged Out   Meningococcal B Vaccine  Aged Out    Health Maintenance  Health Maintenance Due  Topic Date Due   COVID-19 Vaccine (5 - 2024-25 season) 12/08/2022   Health Maintenance Items Addressed: None needed at this time  Additional Screening:  Vision Screening: Recommended annual ophthalmology exams for early detection of glaucoma and other  disorders of the eye.  Dental Screening: Recommended annual dental exams for proper oral hygiene  Community Resource Referral / Chronic Care Management: CRR required this visit?  No   CCM required this visit?  Appt scheduled with PCP   Plan:    I have personally reviewed and noted the following in the patient's chart:   Medical and social history Use of alcohol, tobacco or illicit drugs  Current medications and supplements including opioid prescriptions. Patient is not currently taking opioid prescriptions. Functional ability and status Nutritional status Physical activity Advanced directives List of other physicians Hospitalizations, surgeries, and ER visits in previous 12 months Vitals Screenings to include cognitive, depression, and falls Referrals and appointments  In addition, I have reviewed and discussed with patient certain preventive protocols, quality metrics, and best practice recommendations. A written personalized care plan for preventive services as well as general preventive health recommendations were provided to patient.   Nerissa Bannister, LPN   1/61/0960   After Visit Summary: (MyChart) Due to this being a telephonic visit, the after visit summary with patients personalized plan was offered to patient via MyChart   Notes: Nothing significant to report at this time.

## 2023-06-29 NOTE — Patient Instructions (Addendum)
 Amber Soto , Thank you for taking time out of your busy schedule to complete your Annual Wellness Visit with me. I enjoyed our conversation and look forward to speaking with you again next year. I, as well as your care team,  appreciate your ongoing commitment to your health goals. Please review the following plan we discussed and let me know if I can assist you in the future. Your Game plan/ To Do List     Follow up Visits: Next Medicare AWV with our clinical staff: 06/29/2024 @ 11:30am televisit   Have you seen your provider in the last 6 months (3 months if uncontrolled diabetes)? No Next Office Visit with your provider: 12/15/23  Clinician Recommendations:  Aim for 30 minutes of exercise or brisk walking, 6-8 glasses of water, and 5 servings of fruits and vegetables each day.       This is a list of the screening recommended for you and due dates:  Health Maintenance  Topic Date Due   COVID-19 Vaccine (5 - 2024-25 season) 12/08/2022   Flu Shot  09/16/2023   Mammogram  02/23/2024   Medicare Annual Wellness Visit  06/28/2024   Colon Cancer Screening  10/23/2025   DEXA scan (bone density measurement)  06/13/2028   DTaP/Tdap/Td vaccine (3 - Tdap) 06/25/2030   Pneumonia Vaccine  Completed   Hepatitis C Screening  Completed   Zoster (Shingles) Vaccine  Completed   HPV Vaccine  Aged Out   Meningitis B Vaccine  Aged Out    Advanced directives: (Copy Requested) Please bring a copy of your health care power of attorney and living will to the office to be added to your chart at your convenience. You can mail to [facility] [address] [place], [place] [address] or email to [email or web address] Advance Care Planning is important because it:  [x]  Makes sure you receive the medical care that is consistent with your values, goals, and preferences [x]  It provides guidance to your family and loved ones and reduces their decisional burden about whether or not they are  making the right decisions based on your wishes.  Follow the link provided in your after visit summary or read over the paperwork we have mailed to you to help you started getting your Advance Directives in place. If you need assistance in completing these, please reach out to us  so that we can help you!

## 2023-10-23 ENCOUNTER — Other Ambulatory Visit: Payer: Self-pay | Admitting: Family

## 2023-10-23 DIAGNOSIS — E039 Hypothyroidism, unspecified: Secondary | ICD-10-CM

## 2023-12-15 ENCOUNTER — Encounter: Payer: Self-pay | Admitting: Family

## 2023-12-15 ENCOUNTER — Ambulatory Visit: Admitting: Family

## 2023-12-15 VITALS — BP 134/82 | HR 73 | Temp 98.0°F | Ht 67.0 in | Wt 141.8 lb

## 2023-12-15 DIAGNOSIS — E559 Vitamin D deficiency, unspecified: Secondary | ICD-10-CM

## 2023-12-15 DIAGNOSIS — M810 Age-related osteoporosis without current pathological fracture: Secondary | ICD-10-CM

## 2023-12-15 DIAGNOSIS — R7303 Prediabetes: Secondary | ICD-10-CM | POA: Diagnosis not present

## 2023-12-15 DIAGNOSIS — E039 Hypothyroidism, unspecified: Secondary | ICD-10-CM | POA: Diagnosis not present

## 2023-12-15 DIAGNOSIS — M130 Polyarthritis, unspecified: Secondary | ICD-10-CM | POA: Diagnosis not present

## 2023-12-15 DIAGNOSIS — E538 Deficiency of other specified B group vitamins: Secondary | ICD-10-CM | POA: Diagnosis not present

## 2023-12-15 LAB — TSH: TSH: 1.76 u[IU]/mL (ref 0.35–5.50)

## 2023-12-15 LAB — BASIC METABOLIC PANEL WITH GFR
BUN: 11 mg/dL (ref 6–23)
CO2: 29 meq/L (ref 19–32)
Calcium: 9.8 mg/dL (ref 8.4–10.5)
Chloride: 102 meq/L (ref 96–112)
Creatinine, Ser: 0.76 mg/dL (ref 0.40–1.20)
GFR: 79.85 mL/min (ref >60.00)
Glucose, Bld: 104 mg/dL — ABNORMAL HIGH (ref 70–99)
Potassium: 3.8 meq/L (ref 3.5–5.1)
Sodium: 141 meq/L (ref 135–145)

## 2023-12-15 LAB — C-REACTIVE PROTEIN: CRP: <0.5 mg/dL (ref 0.5–20.0)

## 2023-12-15 LAB — SEDIMENTATION RATE: Sed Rate: 8 mm/h (ref 0–30)

## 2023-12-15 LAB — VITAMIN D 25 HYDROXY (VIT D DEFICIENCY, FRACTURES): VITD: 34.17 ng/mL (ref 30.00–100.00)

## 2023-12-15 LAB — HEMOGLOBIN A1C: Hgb A1c MFr Bld: 6 % (ref 4.6–6.5)

## 2023-12-15 MED ORDER — DENOSUMAB 60 MG/ML ~~LOC~~ SOSY: 60.0000 mg | PREFILLED_SYRINGE | Freq: Once | SUBCUTANEOUS | Status: AC

## 2023-12-15 NOTE — Progress Notes (Signed)
 "  Subjective:  Patient ID: Amber Soto, female    DOB: Jan 23, 1956  Age: 68 y.o. MRN: [id number]  Patient Care Team: Corwin Antu, FNP as PCP - General (Family Medicine) Jeffrie Oneil BROCKS, MD as PCP - Cardiology (Cardiology) Pa, [facility]   CC:  Chief Complaint  Patient presents with   Annual Exam    HPI Amber Soto is a 68 y.o. female who presents today for an annual physical exam. She reports consuming a general diet. She is active outdoors and with her daily tasks She generally feels well. She reports sleeping well. She does not have additional problems to discuss today.   Vision:Within last year Dental:Receives regular dental care   Mammogram: 03/26/23 per pt has it done in [place] Colonoscopy: 10/24/15 every ten years  Bone density scan:06/14/23  Pt is with acute concerns.   Discussed the use of AI scribe software for clinical note transcription with the patient, who gave verbal consent to proceed.  History of Present Illness Amber Soto is a 68 year old female who presents with feelings of being 'down in the dumps' and hand discomfort.  She has been experiencing feelings of being 'down in the dumps' and 'ho hum,' exacerbated by the recent loss of her father-in-law. These feelings are described as a buildup of events, with a lack of interest in usual activities, though she does not believe they significantly affect her life. She denies suicidal thoughts and thinks engaging in more outdoor activities might help. She does not feel the need for medication or therapy.  She reports recent discomfort in her hands and has noticed some nodules, which she wonders may be related to arthritis. These changes were first noticed recently, with occasional thumb sticking after being in a position for a while. No significant stiffness, swelling, or impact on hand function is reported. She has a history of osteoarthritis.  She has been on Prolia  for over five years for  osteopenia, with noted improvement in bone density, particularly in the spine. She is due for her next Prolia  injection soon.  She takes thyroid  medication and vitamin D  regularly, ensuring separation from other medications. 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 She also participates in activities with her grandchildren and works with a programme researcher, broadcasting/film/video.  No issues with sleep, bowel movements, or other joint pains except occasional neck stiffness, which she attributes to gardening and positional factors. No other significant joint pain or symptoms suggestive of autoimmune conditions.      Advanced Directives Patient does not have advanced directives     DEPRESSION SCREENING    12/15/2023   11:18 AM 06/29/2023   11:18 AM 12/14/2022   11:56 AM 06/08/2022   12:33 PM 03/15/2022    3:36 PM 07/21/2021    9:49 AM 01/20/2021   10:38 AM  PHQ 2/9 Scores  PHQ - 2 Score 1 0 0 0 0 0 0  PHQ- 9 Score 8  0   4      ROS: Negative unless specifically indicated above in HPI.    Current Outpatient Medications:    Calcium Carbonate (CALCIUM 600 PO), Take by mouth., Disp: , Rfl:    Cholecalciferol  (VITAMIN D3) 50 MCG (2000 UT) TABS, Take 2,000 Int'l Units by mouth daily., Disp: , Rfl:    Cholecalciferol  1.25 MG (50000 UT) TABS, Take 1 tablet by mouth once a week., Disp: 8 tablet, Rfl: 0   ciprofloxacin (CIPRO) 500 MG tablet,  Take 500 mg by mouth as needed (after sex)., Disp: , Rfl:    Cyanocobalamin  (B-12) 1000 MCG CAPS, Take 1,000 mcg by mouth daily., Disp: , Rfl:    denosumab  (PROLIA ) 60 MG/ML SOLN injection, Inject 60 mg into the skin every 6 (six) months. Administer in upper arm, thigh, or abdomen, Disp: , Rfl:    diclofenac  Sodium (VOLTAREN ) 1 % GEL, Apply 1 application topically 4 (four) times daily. Prn pain, Disp: , Rfl:    estradiol (VAGIFEM) 25 MCG vaginal tablet, Place 25 mcg vaginally 2 (two) times a week., Disp: , Rfl:    Fluticasone Propionate  (FLONASE ALLERGY RELIEF NA), Place 1 spray into the nose daily as needed., Disp: , Rfl:    levothyroxine  (SYNTHROID ) 75 MCG tablet, TAKE 1 TABLET BY MOUTH EVERY DAY IN THE MORNING, Disp: 90 tablet, Rfl: 0   Multiple Vitamin (MULTIVITAMIN) tablet, Take 1 tablet by mouth daily., Disp: , Rfl:    naproxen (NAPROSYN) 500 MG tablet, Take 500 mg by mouth 2 (two) times daily with a meal. Prn pain, Disp: , Rfl:   Current Facility-Administered Medications:    denosumab  (PROLIA ) injection 60 mg, 60 mg, Subcutaneous, Once, Janene Yousuf, FNP   [START ON 12/19/2023] denosumab  (PROLIA ) injection 60 mg, 60 mg, Subcutaneous, Once, Lawanda Holzheimer, FNP    Objective:    BP 134/82 (BP Location: Left Arm, Patient Position: Sitting)   Pulse 73   Temp 98 F (36.7 C) (Temporal)   Ht 5' 7 (1.702 m)   Wt 141 lb 12.8 oz (64.3 kg)   SpO2 98%   BMI 22.21 kg/m   BP Readings from Last 3 Encounters:  12/15/23 134/82  12/14/22 128/88  06/08/22 120/72      Physical Exam Vitals reviewed.  Constitutional:      General: She is not in acute distress.    Appearance: Normal appearance. She is normal weight. She is not ill-appearing.  HENT:     Head: Normocephalic.     Right Ear: Tympanic membrane normal.     Left Ear: Tympanic membrane normal.     Nose: Nose normal.     Mouth/Throat:     Mouth: Mucous membranes are moist.  Eyes:     Extraocular Movements: Extraocular movements intact.     Pupils: Pupils are equal, round, and reactive to light.  Cardiovascular:     Rate and Rhythm: Normal rate and regular rhythm.  Pulmonary:     Effort: Pulmonary effort is normal.     Breath sounds: Normal breath sounds.  Abdominal:     General: Abdomen is flat. Bowel sounds are normal.     Palpations: Abdomen is soft.     Tenderness: There is no guarding or rebound.  Musculoskeletal:        General: Normal range of motion.     Cervical back: Normal range of motion.     Comments: Calcification hard nodule at base of  left second and third metacarpal  Inflammatory raised areas [place] at base of right 2-4 metacarpal   Skin:    General: Skin is warm.     Capillary Refill: Capillary refill takes less than 2 seconds.  Neurological:     General: No focal deficit present.     Mental Status: She is alert.  Psychiatric:        Mood and Affect: Mood normal.        Behavior: Behavior normal.        Thought Content: Thought content normal.  Judgment: Judgment normal.       Results RADIOLOGY Bone density: AP spine T score -1.2, left hip T score -2.2, osteopenic range, improvement noted (06/14/2023) Mammogram: Normal (February 2025)      Assessment & Plan:   Assessment and Plan Assessment & Plan Depressive symptoms Experiencing mild depressive symptoms, likely situational due to recent loss of father-in-law. No suicidal ideation or significant impact on daily functioning. Prefers non-pharmacological interventions and does not feel the need for medication or therapy at this time. 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 Occasional thumb sticking, possibly due to calcifications. No significant stiffness, functional limitation, redness, or swelling. - Monitor for changes in size or function of nodules - Perform stretching exercises and apply warmth to hands - Add lab work to check for autoimmune conditions  Osteopenia and osteoporosis, currently treated with Prolia  Osteopenia and osteoporosis managed with Prolia , showing improvement in bone density. Recent bone density scan indicates improvement in lumbar spine and maintenance in hip. Continued treatment with Prolia  is recommended to prevent rapid bone loss and increased fracture risk. - Continue Prolia  injections as scheduled, injection given  today  - Ensure Prolia  administration is not delayed  Acquired hypothyroidism Condition is well-managed with current treatment. - Continue levothyroxine , ensuring separation from other medications  Vitamin D  deficiency - Continue regular vitamin D  supplementation  General Health Maintenance Generally active, engaging in gardening, walking, and part-time work. Up to date with dental exams and eye exam scheduled for December. Colonoscopy due in 2027. No advanced directive or living will in place. - Encourage continued physical activity - Ensure eye exam is completed in December - Discuss the importance of having an advanced directive or living will -Patient Counseling(The following topics were reviewed):  Preventative care handout given to pt  Health maintenance and immunizations reviewed. Please refer to Health maintenance section. Pt advised on safe sex, wearing seatbelts in car, and proper nutrition labwork ordered today for annual Dental health: Discussed importance of regular tooth brushing, flossing, and dental visits.         Follow-up: Return in about 1 year (around 12/14/2024) for f/u CPE.   Ginger Patrick, FNP  "

## 2023-12-16 ENCOUNTER — Telehealth: Payer: Self-pay

## 2023-12-16 ENCOUNTER — Ambulatory Visit: Payer: Self-pay | Admitting: Family

## 2023-12-16 LAB — RHEUMATOID FACTOR: Rheumatoid fact SerPl-aCnc: 10 [IU]/mL (ref <14)

## 2023-12-16 NOTE — Telephone Encounter (Signed)
 Prolia VOB initiated via [email or web address]  Next Prolia inj DUE: NOW

## 2023-12-18 LAB — ANA W/REFLEX: ANA Titer 1: NEGATIVE

## 2023-12-19 ENCOUNTER — Other Ambulatory Visit (HOSPITAL_COMMUNITY): Payer: Self-pay

## 2023-12-19 NOTE — Telephone Encounter (Signed)
 Pt ready for scheduling for PROLIA  on or after : 12/19/23  Option# 1: Buy/Bill (Office supplied medication)  Out-of-pocket cost due at time of clinic visit: $195.36  Number of injection/visits approved: 2  Primary: [employer] Prolia  co-insurance: $40 Admin fee co-insurance: $40  Secondary: --- Prolia  co-insurance:  Admin fee co-insurance:   Medical Benefit Details: Date Benefits were checked: 12/16/23 Deductible: $84.64 Met of $200 Required/ Coinsurance: $40/ Admin Fee: $40  Prior Auth: APPROVED PA# [id number] Expiration Date: 12/19/23-12/18/24  # of doses approved: 2 ----------------------------------------------------------------------- Option# 2- Med Obtained from pharmacy:  Pharmacy benefit: Copay $60 (Paid to pharmacy) Admin Fee: $40 (Pay at clinic)  Prior Auth: N/A PA# Expiration Date:   # of doses approved:   If patient wants fill through the pharmacy benefit please send prescription to: [facility], and include estimated need by date in rx notes. Pharmacy will ship medication directly to the office.  Patient NOT eligible for Prolia  Copay Card. Copay Card can make patient's cost as little as $25. Link to apply: [email or web address]  ** This summary of benefits is an estimation of the patient's out-of-pocket cost. Exact cost may very based on individual plan coverage.

## 2023-12-19 NOTE — Telephone Encounter (Signed)
 SABRA

## 2023-12-19 NOTE — Telephone Encounter (Signed)
 BUY AND BILL PA SUBMITTED VIA [employer]. Authorization Number : [id number]

## 2023-12-20 ENCOUNTER — Other Ambulatory Visit: Payer: Self-pay

## 2023-12-20 ENCOUNTER — Other Ambulatory Visit: Payer: Self-pay | Admitting: *Deleted

## 2023-12-20 MED ORDER — DENOSUMAB 60 MG/ML ~~LOC~~ SOSY
60.0000 mg | PREFILLED_SYRINGE | Freq: Once | SUBCUTANEOUS | 0 refills | Status: AC
  Filled 2023-12-20: qty 1, 180d supply, fill #0

## 2023-12-20 NOTE — Progress Notes (Signed)
 Pharmacy Patient Advocate Encounter  Insurance verification completed.   The patient is insured through [employer] test claim for Prolia . Co-pay is $60.  This test claim was processed through [facility]- copay amounts may vary at other pharmacies due to pharmacy/plan contracts, or as the patient moves through the different stages of their insurance plan.

## 2023-12-20 NOTE — Progress Notes (Signed)
 Specialty Pharmacy Initial Fill Coordination Note  Amber Soto is a 68 y.o. female contacted today regarding initial fill of specialty medication(s) Denosumab  (PROLIA )   Patient requested Courier to Provider Office   Delivery date: 12/22/23   Verified address: [facility] [address]   Medication will be filled on: 12/21/23   Patient is aware of $60 copayment.

## 2023-12-21 ENCOUNTER — Other Ambulatory Visit: Payer: Self-pay

## 2023-12-27 ENCOUNTER — Ambulatory Visit

## 2023-12-27 DIAGNOSIS — M810 Age-related osteoporosis without current pathological fracture: Secondary | ICD-10-CM | POA: Diagnosis not present

## 2023-12-27 MED ORDER — DENOSUMAB 60 MG/ML ~~LOC~~ SOSY
60.0000 mg | PREFILLED_SYRINGE | Freq: Once | SUBCUTANEOUS | Status: AC
  Administered 2023-12-27: 60 mg via SUBCUTANEOUS

## 2023-12-27 MED ORDER — DENOSUMAB 60 MG/ML ~~LOC~~ SOSY: 60.0000 mg | PREFILLED_SYRINGE | Freq: Once | SUBCUTANEOUS | Status: AC

## 2023-12-27 NOTE — Progress Notes (Signed)
 Per orders of Ginger Patrick, NP, injection of prolia  60 mg Mason given by Laray Arenas in right arm. Patient tolerated injection well. Patient will make appointment for 6 month.

## 2024-01-22 ENCOUNTER — Other Ambulatory Visit: Payer: Self-pay | Admitting: Family

## 2024-01-22 DIAGNOSIS — E039 Hypothyroidism, unspecified: Secondary | ICD-10-CM

## 2024-06-29 ENCOUNTER — Ambulatory Visit

## 2024-12-17 ENCOUNTER — Encounter: Admitting: Family
